# Patient Record
Sex: Male | Born: 1970 | Race: Black or African American | Hispanic: No | Marital: Married | Smoking: Former smoker
Health system: Southern US, Community
[De-identification: ages and names within clinical notes are randomized; demographics above are authoritative.]

## PROBLEM LIST (undated history)

## (undated) DIAGNOSIS — M5430 Sciatica, unspecified side: Secondary | ICD-10-CM

## (undated) DIAGNOSIS — J302 Other seasonal allergic rhinitis: Secondary | ICD-10-CM

## (undated) HISTORY — PX: NECK SURGERY: SHX720

## (undated) HISTORY — PX: BACK SURGERY: SHX140

---

## 1998-12-14 ENCOUNTER — Emergency Department (HOSPITAL_COMMUNITY): Admission: EM | Admit: 1998-12-14 | Discharge: 1998-12-14 | Payer: Self-pay | Admitting: Emergency Medicine

## 1998-12-23 ENCOUNTER — Emergency Department (HOSPITAL_COMMUNITY): Admission: EM | Admit: 1998-12-23 | Discharge: 1998-12-23 | Payer: Self-pay | Admitting: Emergency Medicine

## 1999-01-09 ENCOUNTER — Emergency Department (HOSPITAL_COMMUNITY): Admission: EM | Admit: 1999-01-09 | Discharge: 1999-01-09 | Payer: Self-pay | Admitting: Internal Medicine

## 1999-04-13 ENCOUNTER — Emergency Department (HOSPITAL_COMMUNITY): Admission: EM | Admit: 1999-04-13 | Discharge: 1999-04-13 | Payer: Self-pay | Admitting: Emergency Medicine

## 2000-01-11 ENCOUNTER — Emergency Department (HOSPITAL_COMMUNITY): Admission: EM | Admit: 2000-01-11 | Discharge: 2000-01-11 | Payer: Self-pay | Admitting: Internal Medicine

## 2001-09-24 ENCOUNTER — Emergency Department (HOSPITAL_COMMUNITY): Admission: EM | Admit: 2001-09-24 | Discharge: 2001-09-24 | Payer: Self-pay | Admitting: Emergency Medicine

## 2001-09-26 ENCOUNTER — Emergency Department (HOSPITAL_COMMUNITY): Admission: EM | Admit: 2001-09-26 | Discharge: 2001-09-26 | Payer: Self-pay | Admitting: Emergency Medicine

## 2002-10-12 ENCOUNTER — Emergency Department (HOSPITAL_COMMUNITY): Admission: EM | Admit: 2002-10-12 | Discharge: 2002-10-13 | Payer: Self-pay | Admitting: Emergency Medicine

## 2002-11-20 ENCOUNTER — Emergency Department (HOSPITAL_COMMUNITY): Admission: EM | Admit: 2002-11-20 | Discharge: 2002-11-20 | Payer: Self-pay

## 2006-04-24 ENCOUNTER — Emergency Department (HOSPITAL_COMMUNITY): Admission: EM | Admit: 2006-04-24 | Discharge: 2006-04-24 | Payer: Self-pay | Admitting: *Deleted

## 2008-10-28 ENCOUNTER — Emergency Department (HOSPITAL_COMMUNITY): Admission: EM | Admit: 2008-10-28 | Discharge: 2008-10-28 | Payer: Self-pay | Admitting: Emergency Medicine

## 2009-04-30 ENCOUNTER — Emergency Department (HOSPITAL_COMMUNITY): Admission: EM | Admit: 2009-04-30 | Discharge: 2009-04-30 | Payer: Self-pay | Admitting: Emergency Medicine

## 2009-05-06 ENCOUNTER — Emergency Department (HOSPITAL_COMMUNITY): Admission: EM | Admit: 2009-05-06 | Discharge: 2009-05-06 | Payer: Self-pay | Admitting: Pediatrics

## 2009-05-08 ENCOUNTER — Ambulatory Visit (HOSPITAL_COMMUNITY): Admission: RE | Admit: 2009-05-08 | Discharge: 2009-05-08 | Payer: Self-pay | Admitting: Orthopedic Surgery

## 2009-05-11 ENCOUNTER — Emergency Department (HOSPITAL_COMMUNITY): Admission: EM | Admit: 2009-05-11 | Discharge: 2009-05-11 | Payer: Self-pay | Admitting: Emergency Medicine

## 2009-05-21 ENCOUNTER — Emergency Department (HOSPITAL_COMMUNITY): Admission: EM | Admit: 2009-05-21 | Discharge: 2009-05-21 | Payer: Self-pay | Admitting: Emergency Medicine

## 2009-08-17 ENCOUNTER — Inpatient Hospital Stay (HOSPITAL_COMMUNITY): Admission: EM | Admit: 2009-08-17 | Discharge: 2009-08-19 | Payer: Self-pay | Admitting: Emergency Medicine

## 2009-10-06 ENCOUNTER — Encounter: Admission: RE | Admit: 2009-10-06 | Discharge: 2009-11-04 | Payer: Self-pay | Admitting: Orthopedic Surgery

## 2010-11-29 ENCOUNTER — Encounter: Payer: Self-pay | Admitting: Orthopedic Surgery

## 2011-02-10 LAB — CBC
HCT: 41.7 % (ref 39.0–52.0)
HCT: 43.9 % (ref 39.0–52.0)
Hemoglobin: 14 g/dL (ref 13.0–17.0)
Hemoglobin: 15 g/dL (ref 13.0–17.0)
MCHC: 33.7 g/dL (ref 30.0–36.0)
MCHC: 34.2 g/dL (ref 30.0–36.0)
MCV: 89.9 fL (ref 78.0–100.0)
MCV: 91 fL (ref 78.0–100.0)
Platelets: 165 10*3/uL (ref 150–400)
Platelets: 172 10*3/uL (ref 150–400)
RBC: 4.58 MIL/uL (ref 4.22–5.81)
RBC: 4.89 MIL/uL (ref 4.22–5.81)
RDW: 15 % (ref 11.5–15.5)
RDW: 15.1 % (ref 11.5–15.5)
WBC: 5.4 10*3/uL (ref 4.0–10.5)
WBC: 6.3 10*3/uL (ref 4.0–10.5)

## 2011-02-10 LAB — BASIC METABOLIC PANEL
BUN: 6 mg/dL (ref 6–23)
CO2: 24 mEq/L (ref 19–32)
Calcium: 9.4 mg/dL (ref 8.4–10.5)
Chloride: 105 mEq/L (ref 96–112)
Creatinine, Ser: 0.84 mg/dL (ref 0.4–1.5)
GFR calc Af Amer: >60 mL/min (ref >60)
GFR calc non Af Amer: >60 mL/min (ref >60)
Glucose, Bld: 93 mg/dL (ref 70–99)
Potassium: 3.5 mEq/L (ref 3.5–5.1)
Sodium: 138 mEq/L (ref 135–145)

## 2011-02-10 LAB — APTT: aPTT: 25 seconds (ref 24–37)

## 2011-02-10 LAB — PROTIME-INR
INR: 0.95 (ref 0.00–1.49)
Prothrombin Time: 12.6 seconds (ref 11.6–15.2)

## 2011-02-10 LAB — DIFFERENTIAL
Basophils Absolute: 0 10*3/uL (ref 0.0–0.1)
Basophils Relative: 0 % (ref 0–1)
Eosinophils Absolute: 0.3 10*3/uL (ref 0.0–0.7)
Eosinophils Relative: 4 % (ref 0–5)
Lymphocytes Relative: 19 % (ref 12–46)
Lymphs Abs: 1.2 10*3/uL (ref 0.7–4.0)
Monocytes Absolute: 0.5 10*3/uL (ref 0.1–1.0)
Monocytes Relative: 7 % (ref 3–12)
Neutro Abs: 4.4 10*3/uL (ref 1.7–7.7)
Neutrophils Relative %: 70 % (ref 43–77)

## 2011-08-12 LAB — URINALYSIS, ROUTINE W REFLEX MICROSCOPIC
Bilirubin Urine: NEGATIVE
Glucose, UA: NEGATIVE mg/dL
Hgb urine dipstick: NEGATIVE
Ketones, ur: NEGATIVE mg/dL
Nitrite: NEGATIVE
Protein, ur: NEGATIVE mg/dL
Specific Gravity, Urine: 1.019 (ref 1.005–1.030)
Urobilinogen, UA: 0.2 mg/dL (ref 0.0–1.0)
pH: 7 (ref 5.0–8.0)

## 2012-03-15 ENCOUNTER — Encounter (HOSPITAL_COMMUNITY): Payer: Self-pay | Admitting: Cardiology

## 2012-03-15 ENCOUNTER — Emergency Department (HOSPITAL_COMMUNITY)
Admission: EM | Admit: 2012-03-15 | Discharge: 2012-03-15 | Disposition: A | Discharge status: Home / Self Care | Payer: 59 | Attending: Emergency Medicine | Admitting: Emergency Medicine

## 2012-03-15 DIAGNOSIS — M545 Low back pain, unspecified: Secondary | ICD-10-CM | POA: Insufficient documentation

## 2012-03-15 DIAGNOSIS — F17200 Nicotine dependence, unspecified, uncomplicated: Secondary | ICD-10-CM | POA: Insufficient documentation

## 2012-03-15 DIAGNOSIS — M549 Dorsalgia, unspecified: Secondary | ICD-10-CM

## 2012-03-15 MED ORDER — DIAZEPAM 5 MG PO TABS: 5.0000 mg | ORAL_TABLET | Freq: Two times a day (BID) | ORAL | Status: AC

## 2012-03-15 MED ORDER — OXYCODONE-ACETAMINOPHEN 5-325 MG PO TABS: 1.0000 | ORAL_TABLET | Freq: Four times a day (QID) | ORAL | Status: AC | PRN

## 2012-03-15 NOTE — ED Notes (Signed)
Pt reports that he was working yesterday and was moving something. Reports that he felt a sharp pain in his back. Reports taking tylenol with no relief.

## 2012-03-15 NOTE — Discharge Instructions (Signed)
Followup with orthopedics if symptoms continue after one week. Use conservative methods at home including heat therapy and cold therapy as we discussed. More information on cold therapy is listed below.  It is not recommended to use heat treatment directly after an acute injury.  Use percocet for severe pain.  Do not drive or operate heavy machinery while using Percocet or Valium.  SEEK IMMEDIATE MEDICAL ATTENTION IF: New numbness, tingling, weakness, or problem with the use of your arms or legs.  Severe back pain not relieved with medications.  Change in bowel or bladder control.  Increasing pain in any areas of the body (such as chest or abdominal pain).  Shortness of breath, dizziness or fainting.  Nausea (feeling sick to your stomach), vomiting, fever, or sweats.  COLD THERAPY DIRECTIONS:  Ice or gel packs can be used to reduce both pain and swelling. Ice is the most helpful within the first 24 to 48 hours after an injury or flareup from overusing a muscle or joint.  Ice is effective, has very few side effects, and is safe for most people to use.   If you expose your skin to cold temperatures for too long or without the proper protection, you can damage your skin or nerves. Watch for signs of skin damage due to cold.   HOME CARE INSTRUCTIONS  Follow these tips to use ice and cold packs safely.  Place a dry or damp towel between the ice and skin. A damp towel will cool the skin more quickly, so you may need to shorten the time that the ice is used.  For a more rapid response, add gentle compression to the ice.  Ice for no more than 10 to 20 minutes at a time. The bonier the area you are icing, the less time it will take to get the benefits of ice.  Check your skin after 5 minutes to make sure there are no signs of a poor response to cold or skin damage.  Rest 20 minutes or more in between uses.  Once your skin is numb, you can end your treatment. You can test numbness by very lightly  touching your skin. The touch should be so light that you do not see the skin dimple from the pressure of your fingertip. When using ice, most people will feel these normal sensations in this order: cold, burning, aching, and numbness.  Do not use ice on someone who cannot communicate their responses to pain, such as small children or people with dementia.   HOW TO MAKE AN ICE PACK  To make an ice pack, do one of the following:  Place crushed ice or a bag of frozen vegetables in a sealable plastic bag. Squeeze out the excess air. Place this bag inside another plastic bag. Slide the bag into a pillowcase or place a damp towel between your skin and the bag.  Mix 3 parts water with 1 part rubbing alcohol. Freeze the mixture in a sealable plastic bag. When you remove the mixture from the freezer, it will be slushy. Squeeze out the excess air. Place this bag inside another plastic bag. Slide the bag into a pillowcase or place a damp towel between your skin and the bag.   SEEK MEDICAL CARE IF:  You develop white spots on your skin. This may give the skin a blotchy (mottled) appearance.  Your skin turns blue or pale.  Your skin becomes waxy or hard.  Your swelling gets worse.  MAKE SURE YOU:  Understand these instructions.  Will watch your condition.  Will get help right away if you are not doing well or get worse.   When patients come to the Emergency Department (ED) with acute medical conditions in which the Emergency Department physician feels appropriate to prescribe narcotic or sedating pain medication, the physician will prescribe these in very limited quantities.  The amount of these medications will last only until you can see your primary care physician in his/her office.  Any patient who returns to the ED seeking refills should expect only non-narcotic pain medications.    Prescriptions for narcotic or sedating medications that have been lost, stolen or expired will not be refilled in the  Emergency Department.

## 2012-03-15 NOTE — ED Provider Notes (Signed)
History     CSN: [id number]  Arrival date & time 03/15/12  0715   First MD Initiated Contact with Patient 03/15/12 [phone]      Chief Complaint  Patient presents with  . Back Pain    (Consider location/radiation/quality/duration/timing/severity/associated sxs/prior treatment) HPI Comments: Patient comes in today with a chief complaint of right sided lower back pain.  He reports that he was pulling a palate at work yesterday when the pain begun.  He reports that the pain feels like a pulled muscle.  Pain worse with bending and twisting of lower back.  He has taken tylenol for the pain without relief.    Patient is a 41 y.o. male presenting with back pain. The history is provided by the patient.  Back Pain  The pain does not radiate. The symptoms are aggravated by bending and twisting. Pertinent negatives include no fever, no numbness, no abdominal pain, no bowel incontinence, no perianal numbness, no bladder incontinence, no dysuria, no leg pain, no paresthesias, no tingling and no weakness.    History reviewed. No pertinent past medical history.  Past Surgical History  Procedure Date  . Back surgery     History reviewed. No pertinent family history.  History  Substance Use Topics  . Smoking status: Current Everyday Smoker -- 0.5 packs/day  . Smokeless tobacco: Not on file  . Alcohol Use: Yes      Review of Systems  Constitutional: Negative for fever and chills.  HENT: Negative for neck pain and neck stiffness.   Gastrointestinal: Negative for abdominal pain and bowel incontinence.  Genitourinary: Negative for bladder incontinence, dysuria and decreased urine volume.       No bowel or bladder incontinence  Musculoskeletal: Positive for back pain. Negative for joint swelling.       Pain with ambulation  Skin: Negative for rash.  Neurological: Negative for tingling, weakness, numbness and paresthesias.    Allergies  Review of patient's allergies indicates no known  allergies.  Home Medications  No current outpatient prescriptions on file.  BP 110/73  Pulse 111  Temp(Src) 98 F (36.7 C) (Oral)  Resp 16  SpO2 100%  Physical Exam  Nursing note and vitals reviewed. Constitutional: He is oriented to person, place, and time. He appears well-developed and well-nourished. No distress.  HENT:  Head: Normocephalic and atraumatic.  Eyes: EOM are normal.  Neck: Normal range of motion and full passive range of motion without pain. Neck supple. No spinous process tenderness and no muscular tenderness present. No rigidity. Normal range of motion present.  Cardiovascular: Normal rate, regular rhythm and intact distal pulses.  Exam reveals no gallop and no friction rub.   No murmur heard. Pulmonary/Chest: Effort normal and breath sounds normal. No respiratory distress. He has no wheezes. He has no rales. He exhibits no tenderness.  Musculoskeletal:       Cervical back: He exhibits normal range of motion, no tenderness, no bony tenderness and no pain.       Thoracic back: He exhibits no tenderness, no bony tenderness and no pain.       Lumbar back: He exhibits no tenderness, no bony tenderness, no spasm and normal pulse.       Bilateral lower extremities nontender without color change, baseline range of motion of extremities with intact distal pulses, capillary refill less than 2 seconds bilaterally.  Pt has increased pain w ROM of lumbar spine. Pain w ambulation, no sign of ataxia.  Neurological: He is alert and oriented  to person, place, and time. He has normal strength and normal reflexes. No sensory deficit. Gait (no ataxia, slowed and hunched d/t pain ) abnormal.       sensation at baseline for light touch in all 4 distal extremities, motor symmetric & bilateral 5/5  Abduction,adduction,flexion of hips, knee flexion & extension, foot dorsiflexion, foot plantar flexion.  Skin: Skin is warm and dry. No rash noted. He is not diaphoretic. No erythema. No pallor.    Psychiatric: He has a normal mood and affect.    ED Course  Procedures (including critical care time)  Labs Reviewed - No data to display No results found.   No diagnosis found.    MDM  Patient with back pain.  No neurological deficits and normal neuro exam.  Patient can walk but states is painful.  No loss of bowel or bladder control.  No concern for cauda equina.  No fever, night sweats, weight loss, h/o cancer, IVDU.  RICE protocol and pain medicine indicated and discussed with patient. Suspect that back pain is caused by a muscle strain.  Patient given short course of pain medication and muscle         Pascal Lux [place], [place] 03/15/12 [phone]

## 2012-03-15 NOTE — ED Notes (Signed)
Error on vitals wrong patient

## 2012-03-15 NOTE — ED Provider Notes (Signed)
Medical screening examination/treatment/procedure(s) were performed by non-physician practitioner and as supervising physician I was immediately available for consultation/collaboration.   Glynn Octave, MD 03/15/12 1538

## 2012-06-12 ENCOUNTER — Emergency Department (HOSPITAL_COMMUNITY)
Admission: EM | Admit: 2012-06-12 | Discharge: 2012-06-12 | Disposition: A | Discharge status: Home / Self Care | Payer: 59 | Attending: Emergency Medicine | Admitting: Emergency Medicine

## 2012-06-12 ENCOUNTER — Encounter (HOSPITAL_COMMUNITY): Payer: Self-pay | Admitting: Emergency Medicine

## 2012-06-12 ENCOUNTER — Emergency Department (HOSPITAL_COMMUNITY): Payer: 59

## 2012-06-12 DIAGNOSIS — M25569 Pain in unspecified knee: Secondary | ICD-10-CM | POA: Insufficient documentation

## 2012-06-12 DIAGNOSIS — F17200 Nicotine dependence, unspecified, uncomplicated: Secondary | ICD-10-CM | POA: Insufficient documentation

## 2012-06-12 DIAGNOSIS — M25561 Pain in right knee: Secondary | ICD-10-CM

## 2012-06-12 MED ORDER — IBUPROFEN 600 MG PO TABS: 600.0000 mg | ORAL_TABLET | Freq: Four times a day (QID) | ORAL | Status: AC | PRN

## 2012-06-12 MED ORDER — HYDROCODONE-ACETAMINOPHEN 5-500 MG PO TABS: 1.0000 | ORAL_TABLET | Freq: Four times a day (QID) | ORAL | Status: AC | PRN

## 2012-06-12 MED ORDER — OXYCODONE-ACETAMINOPHEN 5-325 MG PO TABS
1.0000 | ORAL_TABLET | Freq: Once | ORAL | Status: AC
  Administered 2012-06-12: 1 via ORAL
  Filled 2012-06-12: qty 1

## 2012-06-12 NOTE — ED Notes (Signed)
Ortho tech called 

## 2012-06-12 NOTE — ED Provider Notes (Signed)
History     CSN: [id number]  Arrival date & time 06/12/12  0826   First MD Initiated Contact with Patient 06/12/12 [phone]      Chief Complaint  Patient presents with  . Knee Pain    (Consider location/radiation/quality/duration/timing/severity/associated sxs/prior treatment) Patient is a 41 y.o. male presenting with knee pain. The history is provided by the patient.  Knee Pain This is a new problem. The current episode started yesterday. The problem occurs constantly. The problem has been unchanged. Associated symptoms include arthralgias. Pertinent negatives include no chest pain, chills, coughing, fever, joint swelling, numbness, rash or weakness. The symptoms are aggravated by bending and walking. He has tried nothing for the symptoms. The treatment provided no relief.    History reviewed. No pertinent past medical history.  Past Surgical History  Procedure Date  . Back surgery     History reviewed. No pertinent family history.  History  Substance Use Topics  . Smoking status: Current Everyday Smoker -- 0.5 packs/day  . Smokeless tobacco: Not on file  . Alcohol Use: Yes      Review of Systems  Constitutional: Negative for fever and chills.  Respiratory: Negative for cough, chest tightness and shortness of breath.   Cardiovascular: Negative for chest pain, palpitations and leg swelling.  Musculoskeletal: Positive for arthralgias. Negative for joint swelling.  Skin: Negative for rash and wound.  Neurological: Negative for weakness and numbness.    Allergies  Review of patient's allergies indicates no known allergies.  Home Medications   Current Outpatient Rx  Name Route Sig Dispense Refill  . IBUPROFEN 200 MG PO TABS Oral Take 200 mg by mouth every 6 (six) hours as needed. pain      BP 122/74  Temp 98.6 F (37 C) (Oral)  Resp 18  SpO2 100%  Physical Exam  Nursing note and vitals reviewed. Constitutional: He appears well-developed and well-nourished. No  distress.  HENT:  Head: Normocephalic.  Eyes: Conjunctivae are normal.  Cardiovascular: Normal rate, regular rhythm and normal heart sounds.   Pulmonary/Chest: Effort normal and breath sounds normal. No respiratory distress. He has no wheezes. He has no rales.  Musculoskeletal:       Right knee normal appearing when compared to left. No obvious swelling, redness, bruising, wounds. Tender to palpation with even light touch over medial joint. Tenderness over skin, area sensitive. No pain with palpation of the posterior knee, lateral knee joint, anterior knee. Normal calf, soft, non tender. Dorsal pedal pulses normal and equal bilaterally. Pain with rom of the knee. Joint is stable with negative anterior, posterior drawer signs.   Neurological: He is alert.  Skin: Skin is warm and dry. No rash noted.  Psychiatric: He has a normal mood and affect.    ED Course  Procedures (including critical care time)  Labs Reviewed - No data to display Dg Knee Complete 4 Views Right  06/12/2012  *RADIOLOGY REPORT*  Clinical Data: Right knee pain.  RIGHT KNEE - COMPLETE 4+ VIEW  Comparison: None.  Findings: There is chondrocalcinosis.  Mild degenerative changes in the right knee. No acute bony abnormality.  Specifically, no fracture, subluxation, or dislocation.  Soft tissues are intact. No joint effusion.  IMPRESSION: No acute bony abnormality.  Original Report Authenticated By: Cyndie Chime, M.D.    X-ray negative. Joint is stable. No signs  Of infection. No injuries. There is sensitivity and pain with even light skin deep touch to the medial right knee. Suspect possible early shingles vs  gout. Knee sleeve for swelling. Pain medications at home. Follow up with orthopedics.   1. Knee pain, right       MDM          Lottie Mussel, PA 06/12/12 [phone]

## 2012-06-12 NOTE — ED Notes (Signed)
Pt reports right knee pain and swelling that began around 4am. Pt denies known injury.  Pt reports increased pain with ambulation.

## 2012-06-14 NOTE — ED Provider Notes (Signed)
Medical screening examination/treatment/procedure(s) were performed by non-physician practitioner and as supervising physician I was immediately available for consultation/collaboration.  Kennah Hehr T Tenea Sens, MD 06/14/12 0815 

## 2014-03-28 ENCOUNTER — Encounter (HOSPITAL_COMMUNITY): Payer: Self-pay | Admitting: Emergency Medicine

## 2014-03-28 ENCOUNTER — Emergency Department (HOSPITAL_COMMUNITY)
Admission: EM | Admit: 2014-03-28 | Discharge: 2014-03-28 | Disposition: A | Discharge status: Home / Self Care | Payer: 59 | Attending: Emergency Medicine | Admitting: Emergency Medicine

## 2014-03-28 DIAGNOSIS — F17200 Nicotine dependence, unspecified, uncomplicated: Secondary | ICD-10-CM | POA: Insufficient documentation

## 2014-03-28 DIAGNOSIS — M5431 Sciatica, right side: Secondary | ICD-10-CM

## 2014-03-28 DIAGNOSIS — R209 Unspecified disturbances of skin sensation: Secondary | ICD-10-CM | POA: Insufficient documentation

## 2014-03-28 DIAGNOSIS — M5430 Sciatica, unspecified side: Secondary | ICD-10-CM | POA: Insufficient documentation

## 2014-03-28 DIAGNOSIS — IMO0001 Reserved for inherently not codable concepts without codable children: Secondary | ICD-10-CM | POA: Insufficient documentation

## 2014-03-28 DIAGNOSIS — Z9889 Other specified postprocedural states: Secondary | ICD-10-CM | POA: Insufficient documentation

## 2014-03-28 HISTORY — DX: Sciatica, unspecified side: M54.30

## 2014-03-28 MED ORDER — PREDNISONE 20 MG PO TABS: ORAL_TABLET | ORAL | Status: DC

## 2014-03-28 MED ORDER — OXYCODONE-ACETAMINOPHEN 5-325 MG PO TABS
2.0000 | ORAL_TABLET | Freq: Once | ORAL | Status: AC
  Administered 2014-03-28: 2 via ORAL
  Filled 2014-03-28: qty 2

## 2014-03-28 MED ORDER — OXYCODONE-ACETAMINOPHEN 5-325 MG PO TABS: 2.0000 | ORAL_TABLET | ORAL | Status: DC | PRN

## 2014-03-28 MED ORDER — METHOCARBAMOL 500 MG PO TABS: 500.0000 mg | ORAL_TABLET | Freq: Two times a day (BID) | ORAL | Status: DC

## 2014-03-28 NOTE — ED Notes (Addendum)
Pt moved to rm Fast [address], crying, unable to stand, dragging leg,

## 2014-03-28 NOTE — ED Provider Notes (Signed)
CSN: [id number]     Arrival date & time 03/28/14  1314 History  This chart was scribed for non-physician practitioner, Fayrene Helper, PA-C working with Gwyneth Sprout, MD by Greggory Stallion, ED scribe. This patient was seen in room TR05C/TR05C and the patient's care was started at 2:06 PM.   Chief Complaint  Patient presents with  . Back Pain   The history is provided by the patient. No language interpreter was used.   HPI Comments: Gabriel Norman is a 43 y.o. male with history of sciatic and herniated disc who presents to the Emergency Department complaining of gradual onset, sharp, stabbing, aching lower back pain that radiates into his right buttocks and down the back of his leg that started a couple of months ago. Pt also has associated numbness in his right leg and foot that started 2 weeks ago. Denies recent fall or injury. He has had intermittent back pain for about 3 years. Bearing weight and movement worsen the pain. He has taken Goody powders and ibuprofen with no relief. Denies fever, bowel or bladder incontinence, saddle paraesthesias, rash. Denies history of cancer or IV drug use. Pt had back surgery for a slipped disc done by Dr. Manson Passey 3 years ago.   Past Medical History  Diagnosis Date  . Sciatica    Past Surgical History  Procedure Laterality Date  . Back surgery     No family history on file. History  Substance Use Topics  . Smoking status: Current Every Day Smoker -- 0.50 packs/day  . Smokeless tobacco: Not on file  . Alcohol Use: Yes    Review of Systems  Constitutional: Negative for fever.  Genitourinary:       Negative for bowel or bladder incontinence.  Musculoskeletal: Positive for back pain and myalgias.  Skin: Negative for rash.  Neurological: Positive for numbness.  All other systems reviewed and are negative.  Allergies  Review of patient's allergies indicates no known allergies.  Home Medications   Prior to Admission medications   Medication Sig  Start Date End Date Taking? Authorizing Provider  ibuprofen (ADVIL,MOTRIN) 200 MG tablet Take 200 mg by mouth every 6 (six) hours as needed. pain    Historical Provider, MD   BP 119/75  Pulse 79  Temp(Src) 97.5 F (36.4 C) (Oral)  Resp 18  SpO2 100%  Physical Exam  Nursing note and vitals reviewed. Constitutional: He is oriented to person, place, and time. He appears well-developed and well-nourished. No distress.  HENT:  Head: Normocephalic and atraumatic.  Eyes: EOM are normal.  Neck: Neck supple. No tracheal deviation present.  Cardiovascular: Normal rate and intact distal pulses.   Pulmonary/Chest: Effort normal. No respiratory distress.  Musculoskeletal: Normal range of motion. He exhibits tenderness. He exhibits no edema.  Tenderness to lumbar spine and lumbosacral region.   Positive straight leg raise.    Neurological: He is alert and oriented to person, place, and time. He has normal reflexes.  Skin: Skin is warm and dry.  Psychiatric: He has a normal mood and affect. His behavior is normal.    ED Course  Procedures (including critical care time)  DIAGNOSTIC STUDIES: Oxygen Saturation is 100% on RA, normal by my interpretation.    COORDINATION OF CARE: 2:12 PM-Discussed treatment plan which includes pain medication, a muscle relaxer and a steroid with pt at bedside and pt agreed to plan. Will give pt orthopedic referral and advised him to follow up. Suspect R side sciatica. No red flags.  Doubt  caudal equina syndrome.   Labs Review Labs Reviewed - No data to display  Imaging Review No results found.   EKG Interpretation None      MDM   Final diagnoses:  Sciatica of right side    BP 119/75  Pulse 79  Temp(Src) 97.5 F (36.4 C) (Oral)  Resp 18  SpO2 100%   I personally performed the services described in this documentation, which was scribed in my presence. The recorded information has been reviewed and is accurate.  Fayrene HelperBowie Jacklyn Branan, PA-C 03/28/14  1418

## 2014-03-28 NOTE — ED Provider Notes (Signed)
Medical screening examination/treatment/procedure(s) were performed by non-physician practitioner and as supervising physician I was immediately available for consultation/collaboration.   EKG Interpretation None        Aneliese Beaudry, MD 03/28/14 1506 

## 2014-03-28 NOTE — ED Notes (Addendum)
Pt c/o lower back pain to right leg "for months". Has gotten worse past several weeks. Had back surgery 3 years ago by "Dr. Manson Passey". (?)  States has not seen a doctor about this. States is unable to move right leg.

## 2014-03-28 NOTE — Discharge Instructions (Signed)

## 2014-03-28 NOTE — ED Notes (Addendum)
Pt reports that he is having lower back pain into right buttocks and leg pain. Has hx of chronic back pain. Walked to triage room using both legs with assistance of crutches due to pressure on that leg is painful.

## 2014-04-18 ENCOUNTER — Other Ambulatory Visit (HOSPITAL_COMMUNITY): Payer: Self-pay | Admitting: Orthopaedic Surgery

## 2014-04-18 DIAGNOSIS — M545 Low back pain, unspecified: Secondary | ICD-10-CM

## 2014-04-25 ENCOUNTER — Ambulatory Visit (HOSPITAL_COMMUNITY)
Admission: RE | Admit: 2014-04-25 | Discharge: 2014-04-25 | Disposition: A | Discharge status: Home / Self Care | Payer: 59 | Source: Ambulatory Visit | Attending: Orthopaedic Surgery | Admitting: Orthopaedic Surgery

## 2014-04-25 DIAGNOSIS — M48061 Spinal stenosis, lumbar region without neurogenic claudication: Secondary | ICD-10-CM | POA: Insufficient documentation

## 2014-04-25 DIAGNOSIS — M545 Low back pain, unspecified: Secondary | ICD-10-CM

## 2014-04-25 DIAGNOSIS — M5126 Other intervertebral disc displacement, lumbar region: Secondary | ICD-10-CM | POA: Insufficient documentation

## 2014-04-25 DIAGNOSIS — M5430 Sciatica, unspecified side: Secondary | ICD-10-CM | POA: Insufficient documentation

## 2014-04-25 MED ORDER — GADOBENATE DIMEGLUMINE 529 MG/ML IV SOLN
15.0000 mL | Freq: Once | INTRAVENOUS | Status: AC | PRN
  Administered 2014-04-25: 15 mL via INTRAVENOUS

## 2014-07-29 ENCOUNTER — Encounter (HOSPITAL_COMMUNITY): Payer: Self-pay | Admitting: Emergency Medicine

## 2014-07-29 ENCOUNTER — Emergency Department (HOSPITAL_COMMUNITY)
Admission: EM | Admit: 2014-07-29 | Discharge: 2014-07-29 | Disposition: A | Discharge status: Home / Self Care | Payer: Worker's Compensation | Attending: Emergency Medicine | Admitting: Emergency Medicine

## 2014-07-29 DIAGNOSIS — Z9889 Other specified postprocedural states: Secondary | ICD-10-CM | POA: Insufficient documentation

## 2014-07-29 DIAGNOSIS — F17200 Nicotine dependence, unspecified, uncomplicated: Secondary | ICD-10-CM | POA: Insufficient documentation

## 2014-07-29 DIAGNOSIS — M545 Low back pain, unspecified: Secondary | ICD-10-CM | POA: Insufficient documentation

## 2014-07-29 DIAGNOSIS — M5430 Sciatica, unspecified side: Secondary | ICD-10-CM | POA: Insufficient documentation

## 2014-07-29 DIAGNOSIS — M5431 Sciatica, right side: Secondary | ICD-10-CM

## 2014-07-29 DIAGNOSIS — Z7982 Long term (current) use of aspirin: Secondary | ICD-10-CM | POA: Insufficient documentation

## 2014-07-29 DIAGNOSIS — Z79899 Other long term (current) drug therapy: Secondary | ICD-10-CM | POA: Insufficient documentation

## 2014-07-29 MED ORDER — METHOCARBAMOL 500 MG PO TABS: 500.0000 mg | ORAL_TABLET | Freq: Two times a day (BID) | ORAL | Status: DC

## 2014-07-29 MED ORDER — HYDROCODONE-ACETAMINOPHEN 5-325 MG PO TABS: 2.0000 | ORAL_TABLET | ORAL | Status: DC | PRN

## 2014-07-29 MED ORDER — HYDROCODONE-ACETAMINOPHEN 5-325 MG PO TABS
2.0000 | ORAL_TABLET | Freq: Once | ORAL | Status: AC
  Administered 2014-07-29: 2 via ORAL
  Filled 2014-07-29: qty 2

## 2014-07-29 MED ORDER — PREDNISONE 20 MG PO TABS: 40.0000 mg | ORAL_TABLET | Freq: Every day | ORAL | Status: DC

## 2014-07-29 NOTE — Discharge Instructions (Signed)
Take prednisone as directed until gone. Take Vicodin as needed for pain. Take Robaxin as needed for muscle spasm. Follow up with Dr. Ophelia Charter for further evaluation and management of your pain.

## 2014-07-29 NOTE — ED Provider Notes (Signed)
CSN: [id number]     Arrival date & time 07/29/14  1191 History  This chart was scribed for non-physician practitioner Emilia Beck, PA-C working with Doug Sou, MD by Leone Payor, ED Scribe. This patient was seen in room TR07C/TR07C and the patient's care was started at 9:28 AM.    Chief Complaint  Patient presents with  . Back Pain    The history is provided by the patient. No language interpreter was used.    HPI Comments: Gabriel Norman is a 43 y.o. male who presents to the Emergency Department complaining of 1 year of chronic, constant lower back pain with associated altered sensations to the left lower extremity and foot that has worsened over the last few days. He states he initially injured his back at work last year and continues to have pain now. He has been taking Goody Powder without relief. He states he has followed up with a Dr. Ophelia Charter who states patient needs surgery. He denies abdominal pain, difficulty with ambulation.   Past Medical History  Diagnosis Date  . Sciatica    Past Surgical History  Procedure Laterality Date  . Back surgery     No family history on file. History  Substance Use Topics  . Smoking status: Current Every Day Smoker -- 0.50 packs/day  . Smokeless tobacco: Not on file  . Alcohol Use: Yes    Review of Systems  Constitutional: Negative for fever, chills and fatigue.  HENT: Negative for trouble swallowing.   Eyes: Negative for visual disturbance.  Respiratory: Negative for shortness of breath.   Cardiovascular: Negative for chest pain and palpitations.  Gastrointestinal: Negative for nausea, vomiting, abdominal pain and diarrhea.  Genitourinary: Negative for dysuria and difficulty urinating.  Musculoskeletal: Positive for back pain. Negative for arthralgias, gait problem and neck pain.  Skin: Negative for color change.  Neurological: Negative for dizziness and weakness.  Psychiatric/Behavioral: Negative for dysphoric mood.       Allergies  Review of patient's allergies indicates no known allergies.  Home Medications   Prior to Admission medications   Medication Sig Start Date End Date Taking? Authorizing Provider  Aspirin-Acetaminophen-Caffeine (GOODY HEADACHE PO) Take 1 packet by mouth 4 (four) times daily as needed (for pain).    Historical Provider, MD  methocarbamol (ROBAXIN) 500 MG tablet Take 1 tablet (500 mg total) by mouth 2 (two) times daily. 03/28/14   Fayrene Helper, PA-C  oxyCODONE-acetaminophen (PERCOCET/ROXICET) 5-325 MG per tablet Take 2 tablets by mouth every 4 (four) hours as needed for severe pain. 03/28/14   Fayrene Helper, PA-C  predniSONE (DELTASONE) 20 MG tablet 3 tabs po day one, then 2 tabs daily x 4 days 03/28/14   Fayrene Helper, PA-C   BP 113/82  Pulse 79  Temp(Src) 98.4 F (36.9 C) (Oral)  Resp 18  SpO2 100% Physical Exam  Nursing note and vitals reviewed. Constitutional: He is oriented to person, place, and time. He appears well-developed and well-nourished.  HENT:  Head: Normocephalic and atraumatic.  Cardiovascular: Normal rate, regular rhythm and normal heart sounds.   Pulmonary/Chest: Effort normal and breath sounds normal. No respiratory distress. He has no wheezes. He has no rales.  Abdominal: Soft. He exhibits no distension. There is no tenderness.  Musculoskeletal:  No midline spine tenderness to palpation. Right gluteal tenderness to palpation. Right lumbar paraspinal tenderness to palpation.   Neurological: He is alert and oriented to person, place, and time.  Lower extremity strength and sensation equal and intact.   Skin:  Skin is warm and dry.  Psychiatric: He has a normal mood and affect.    ED Course  Procedures (including critical care time)  DIAGNOSTIC STUDIES: Oxygen Saturation is 100% on RA, normal by my interpretation.    COORDINATION OF CARE: 9:30 AM Will prescribe pain medication and muscle relaxant. Discussed treatment plan with pt at bedside and pt  agreed to plan.   Labs Review Labs Reviewed - No data to display  Imaging Review No results found.   EKG Interpretation None      MDM   Final diagnoses:  Sciatica, right    Patient likely having sciatica on the right. Patient is able to ambulate. No bladder/bowel incontinence or saddle paresthesias. Vitals stable and patient afebrile. Patient advised to follow up with Dr. Ophelia Charter. Patient will have prednisone, vicodin and robaxin for pain.   I personally performed the services described in this documentation, which was scribed in my presence. The recorded information has been reviewed and is accurate.   Emilia Beck, PA-C 07/29/14 [address] PA-C 07/29/14 1120

## 2014-07-29 NOTE — ED Notes (Signed)
Patient states he had an accident at work last year and is still having pain.   Patient was referred by hospital to ortho, which was workers comp.   Patient states he can't afford to go.   Patient states pain in lumbar region, and has bulging discs.

## 2014-07-29 NOTE — ED Provider Notes (Signed)
Medical screening examination/treatment/procedure(s) were performed by non-physician practitioner and as supervising physician I was immediately available for consultation/collaboration.   EKG Interpretation None       Doug Sou, MD 07/29/14 1622

## 2015-08-08 ENCOUNTER — Ambulatory Visit (INDEPENDENT_AMBULATORY_CARE_PROVIDER_SITE_OTHER): Payer: BLUE CROSS/BLUE SHIELD | Admitting: Family Medicine

## 2015-08-08 ENCOUNTER — Ambulatory Visit (INDEPENDENT_AMBULATORY_CARE_PROVIDER_SITE_OTHER): Payer: BLUE CROSS/BLUE SHIELD

## 2015-08-08 VITALS — BP 108/70 | HR 72 | Temp 98.5°F | Resp 18 | Ht 69.0 in | Wt 161.0 lb

## 2015-08-08 DIAGNOSIS — M25552 Pain in left hip: Secondary | ICD-10-CM | POA: Diagnosis not present

## 2015-08-08 DIAGNOSIS — R935 Abnormal findings on diagnostic imaging of other abdominal regions, including retroperitoneum: Secondary | ICD-10-CM

## 2015-08-08 DIAGNOSIS — R937 Abnormal findings on diagnostic imaging of other parts of musculoskeletal system: Secondary | ICD-10-CM

## 2015-08-08 MED ORDER — MELOXICAM 7.5 MG PO TABS: 7.5000 mg | ORAL_TABLET | Freq: Every day | ORAL | Status: DC | PRN

## 2015-08-08 MED ORDER — HYDROCODONE-ACETAMINOPHEN 5-325 MG PO TABS
1.0000 | ORAL_TABLET | Freq: Four times a day (QID) | ORAL | Status: DC | PRN
Start: 2015-08-08 — End: 2016-07-24

## 2015-08-08 NOTE — Progress Notes (Signed)
Subjective:    Patient ID: Gabriel Norman, male    DOB: 28-May-1971, 44 y.o.   MRN: [id number] This chart was scribed for Meredith Staggers, MD by Littie Deeds, Medical Scribe. This patient was seen in Room 3 and the patient's care was started at 9:59 AM.   HPI HPI Comments: Gabriel Norman is a 44 y.o. male who presents to the [facility] complaining of gradual onset, worsening left hip pain that started 3-4 days ago after waking up. Patient thinks he may have slept wrong. He took a few ibuprofen when the symptoms started with some relief. The next day, the pain worsened, so he took some more ibuprofen and goody powder. The pain is worse with bearing weight and changing positions. The pain is not worse when laying down. He has been using old crutches which reduces the pain when ambulating. The pain does not feel like sciatic pain he has had in the past. He feels as if the pain is muscular in nature. Patient denies activity change, fever, night sweats, unexplained weight loss, urinary symptoms, saddle anesthesia, and rash. He denies known injury and having similar pain in the past. He has had physical therapy in the past. 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   There are no active problems to display for this patient.  Past Medical History  Diagnosis Date  . Sciatica   . Allergy    Past Surgical History  Procedure Laterality Date  . Back surgery    . Neck surgery     No Known Allergies Prior to Admission medications   Medication Sig Start Date End Date Taking? Authorizing Provider  Aspirin-Acetaminophen-Caffeine (GOODY HEADACHE PO) Take 1 packet by mouth 4 (four) times daily as needed (for pain).   Yes Historical Provider, MD  ibuprofen (ADVIL,MOTRIN) 200 MG tablet Take 200 mg by mouth every 6 (six) hours as needed.   Yes Historical Provider, MD  HYDROcodone-acetaminophen (NORCO/VICODIN) 5-325 MG per tablet Take 2 tablets by mouth every 4 (four) hours as  needed for moderate pain or severe pain. Patient not taking: Reported on 08/08/2015 07/29/14   Emilia Beck, PA-C  methocarbamol (ROBAXIN) 500 MG tablet Take 1 tablet (500 mg total) by mouth 2 (two) times daily. Patient not taking: Reported on 08/08/2015 03/28/14   Fayrene Helper, PA-C  methocarbamol (ROBAXIN) 500 MG tablet Take 1 tablet (500 mg total) by mouth 2 (two) times daily. Patient not taking: Reported on 08/08/2015 07/29/14   Emilia Beck, PA-C  oxyCODONE-acetaminophen (PERCOCET/ROXICET) 5-325 MG per tablet Take 2 tablets by mouth every 4 (four) hours as needed for severe pain. Patient not taking: Reported on 08/08/2015 03/28/14   Fayrene Helper, PA-C  predniSONE (DELTASONE) 20 MG tablet 3 tabs po day one, then 2 tabs daily x 4 days Patient not taking: Reported on 08/08/2015 03/28/14   Fayrene Helper, PA-C  predniSONE (DELTASONE) 20 MG tablet Take 2 tablets (40 mg total) by mouth daily. Take 40 mg by mouth daily for 3 days, then 20mg  by mouth daily for 3 days, then 10mg  daily for 3 days Patient not taking: Reported on 08/08/2015 07/29/14   Emilia Beck, PA-C   Social History   Social History  . Marital Status: Single    Spouse Name: N/A  . Number of Children: N/A  . Years of Education: N/A   Occupational History  . Not on file.   Social History Main Topics  . Smoking status: Current  Every Day Smoker -- 0.50 packs/day  . Smokeless tobacco: Not on file  . Alcohol Use: 1.2 oz/week    2 Standard drinks or equivalent per week  . Drug Use: No  . Sexual Activity: Not on file   Other Topics Concern  . Not on file   Social History Narrative     Review of Systems  Constitutional: Negative for fever, diaphoresis, activity change and unexpected weight change.  Genitourinary: Negative.  Negative for enuresis.  Musculoskeletal: Positive for myalgias and arthralgias.  Skin: Negative for rash.  Neurological: Negative for numbness.       Objective:   Physical Exam  Constitutional: He  is oriented to person, place, and time. He appears well-developed and well-nourished. No distress.  HENT:  Head: Normocephalic and atraumatic.  Mouth/Throat: Oropharynx is clear and moist. No oropharyngeal exudate.  Eyes: Pupils are equal, round, and reactive to light.  Neck: Neck supple.  Cardiovascular: Normal rate.   Pulmonary/Chest: Effort normal.  Abdominal: Soft. There is no tenderness.  Musculoskeletal: He exhibits no edema.  Lumbar spine non-tender. Minimal left SI tenderness. Sciatic notch non-tender. No pain over trochanteric bursa. Left hip full flexion and extension. Negative Fadir. Negative Faber. Minimal discomfort posteriorly with terminal and internal rotation.  Neurological: He is alert and oriented to person, place, and time. No cranial nerve deficit.  Skin: Skin is warm and dry. No rash noted.  Psychiatric: He has a normal mood and affect. His behavior is normal.  Nursing note and vitals reviewed.  [facility] (PRIMARY) x-ray report read by Dr. Neva Seat:  Left hip - No apparent fracture or acute findings.  SI joint/pelvis - cystic vs. lytic appearing areas within the left iliac wing. Actual SI joint appears to be unaffected. No apparent cortex involvement.   XR report shows a 6 cm non-aggressive appearing lucent cystic lesion in the iliac bone. Appear to be chronic as also seen in the MRI of lumbar spine June last year. 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   Filed Vitals:   08/08/15 0923  BP: 108/70  Pulse: 72  Temp: 98.5 F (36.9 C)  TempSrc: Oral  Resp: 18  Height:  (1.753 m)  Weight: 161 lb (73.029 kg)  SpO2: 99%       Assessment & Plan:   Gabriel Norman is a 44 y.o. male Left hip pain - Plan: DG HIP UNILAT W OR W/O PELVIS 2-3 VIEWS LEFT, meloxicam (MOBIC) 7.5 MG tablet, HYDROcodone-acetaminophen (NORCO/VICODIN) 5-325 MG tablet  Arthralgia of left side of pelvis - Plan: DG Si Joints, meloxicam (MOBIC) 7.5 MG tablet, HYDROcodone-acetaminophen (NORCO/VICODIN)  5-325 MG tablet  Abnormal x-ray of pelvis  Suspected SI dysfunction or sacroiliitis.  No known injury or trauma. 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  -Mobic 7.5 mg 1-2 daily. Avoid other NSAIDs. Hydrocodone as needed for breakthrough pain. Crutches as needed until toe-touch weight-bear without difficulty then weight-bear as tolerated  -If not significantly improved in the next 4-5 days, would recommend MRI for further evaluation of this area in the cystic-appearing lesions.  RTC precautions  Meds ordered this encounter  Medications  . ibuprofen (ADVIL,MOTRIN) 200 MG tablet    Sig: Take 200 mg by mouth every 6 (six) hours as needed.  . meloxicam (MOBIC) 7.5 MG tablet    Sig: Take 1-2 tablets (7.5-15 mg total) by mouth daily as needed for pain.    Dispense:  30 tablet  Refill:  0  . HYDROcodone-acetaminophen (NORCO/VICODIN) 5-325 MG tablet    Sig: Take 1 tablet by mouth every 6 (six) hours as needed for moderate pain.    Dispense:  15 tablet    Refill:  0   Patient Instructions  You may  have a sprained ligament or strained area at the SI joint (lower back). Try the mobic each morning (do not combine with other over the counter pain relievers), hydrocodone if needed for breakthrough pain. Heat or ice to the areas needed. Range of motion as tolerated. Use crutches until you're not having any pain with weightbearing. Once you are able to toe touch weight-bear then can progress to weightbearing as tolerated as long as pain has improved. If symptoms have not significantly improved in next 4-5 days, I would recommend we check an MRI to further evaluate the abnormal findings on the initial x-ray here today.  Sacroiliac Joint Dysfunction The sacroiliac joint connects the lower part of the spine (the sacrum) with the bones of the pelvis. CAUSES  Sometimes, there is no obvious reason for sacroiliac joint dysfunction.  Other times, it may occur   During pregnancy.  After injury, such as:  Car accidents.  Sport-related injuries.  Work-related injuries.  Due to one leg being shorter than the other.  Due to other conditions that affect the joints, such as:  Rheumatoid arthritis.  Gout.  Psoriasis.  Joint infection (septic arthritis). SYMPTOMS  Symptoms may include:  Pain in the:  Lower back.  Buttocks.  Groin.  Thighs and legs.  Difficult sitting, standing, walking, lying, bending or lifting. DIAGNOSIS  A number of tests may be used to help diagnose the cause of sacroiliac joint dysfunction, including:  Imaging tests to look for other causes of pain, including:  MRI.  CT scan.  Bone scan.  Diagnostic injection: During a special x-ray (called fluoroscopy), a needle is put into the sacroiliac joint. A numbing medicine is injected into the joint. If the pain is improved or stopped, the diagnosis of sacroiliac joint dysfunction is more likely. TREATMENT  There are a number of types of treatment used for sacroiliac joint dysfunction, including:  Only take over-the-counter or prescription medicines for pain, discomfort, or fever as directed by your caregiver.  Medications to relax muscles.  Rest. Decreasing activity can help cut down on painful muscle spasms and allow the back to heal.  Application of heat or ice to the lower back may improve muscle spasms and soothe pain.  Brace. A special back brace, called a sacroiliac belt, can help support the joint while your back is healing.  Physical therapy can help teach comfortable positions and exercises to strengthen muscles that support the sacroiliac joint.  Cortisone injections. Injections of steroid medicine into the joint can help decrease swelling and improve pain.  Hyaluronic acid injections. This chemical improves lubrication within the sacroiliac joint, thereby decreasing pain.  Radiofrequency ablation. A special  needle is placed into the joint, where it burns away nerves that are carrying pain messages from the joint.  Surgery. Because pain occurs during movement of the joint, screws and plates may be installed in order to limit or prevent joint motion. HOME CARE INSTRUCTIONS   Take all medications exactly as directed.  Follow instructions regarding both rest and physical activity, to avoid worsening the pain.  Do physical therapy exercises exactly as prescribed. SEEK IMMEDIATE MEDICAL CARE IF:  You experience increasingly severe pain.  You develop new symptoms, such as numbness or tingling  in your legs or feet.  You lose bladder or bowel control. Document Released: 01/20/2009 Document Revised: 01/16/2012 Document Reviewed: 01/20/2009 [facility] Patient Information 2015 [place], [place]. This information is not intended to replace advice given to you by your health care provider. Make sure you discuss any questions you have with your health care provider.     I personally performed the services described in this documentation, which was scribed in my presence. The recorded information has been reviewed and considered, and addended by me as needed.      By signing my name below, I, Littie Deeds, attest that this documentation has been prepared under the direction and in the presence of Meredith Staggers, MD.  Electronically Signed: Littie Deeds, Medical Scribe. 08/08/2015. 9:59 AM.

## 2015-08-08 NOTE — Patient Instructions (Signed)
You may  have a sprained ligament or strained area at the SI joint (lower back). Try the mobic each morning (do not combine with other over the counter pain relievers), hydrocodone if needed for breakthrough pain. Heat or ice to the areas needed. Range of motion as tolerated. Use crutches until you're not having any pain with weightbearing. Once you are able to toe touch weight-bear then can progress to weightbearing as tolerated as long as pain has improved. If symptoms have not significantly improved in next 4-5 days, I would recommend we check an MRI to further evaluate the abnormal findings on the initial x-ray here today.  Sacroiliac Joint Dysfunction The sacroiliac joint connects the lower part of the spine (the sacrum) with the bones of the pelvis. CAUSES  Sometimes, there is no obvious reason for sacroiliac joint dysfunction. Other times, it may occur   During pregnancy.  After injury, such as:  Car accidents.  Sport-related injuries.  Work-related injuries.  Due to one leg being shorter than the other.  Due to other conditions that affect the joints, such as:  Rheumatoid arthritis.  Gout.  Psoriasis.  Joint infection (septic arthritis). SYMPTOMS  Symptoms may include:  Pain in the:  Lower back.  Buttocks.  Groin.  Thighs and legs.  Difficult sitting, standing, walking, lying, bending or lifting. DIAGNOSIS  A number of tests may be used to help diagnose the cause of sacroiliac joint dysfunction, including:  Imaging tests to look for other causes of pain, including:  MRI.  CT scan.  Bone scan.  Diagnostic injection: During a special x-ray (called fluoroscopy), a needle is put into the sacroiliac joint. A numbing medicine is injected into the joint. If the pain is improved or stopped, the diagnosis of sacroiliac joint dysfunction is more likely. TREATMENT  There are a number of types of treatment used for sacroiliac joint dysfunction, including:  Only  take over-the-counter or prescription medicines for pain, discomfort, or fever as directed by your caregiver.  Medications to relax muscles.  Rest. Decreasing activity can help cut down on painful muscle spasms and allow the back to heal.  Application of heat or ice to the lower back may improve muscle spasms and soothe pain.  Brace. A special back brace, called a sacroiliac belt, can help support the joint while your back is healing.  Physical therapy can help teach comfortable positions and exercises to strengthen muscles that support the sacroiliac joint.  Cortisone injections. Injections of steroid medicine into the joint can help decrease swelling and improve pain.  Hyaluronic acid injections. This chemical improves lubrication within the sacroiliac joint, thereby decreasing pain.  Radiofrequency ablation. A special needle is placed into the joint, where it burns away nerves that are carrying pain messages from the joint.  Surgery. Because pain occurs during movement of the joint, screws and plates may be installed in order to limit or prevent joint motion. HOME CARE INSTRUCTIONS   Take all medications exactly as directed.  Follow instructions regarding both rest and physical activity, to avoid worsening the pain.  Do physical therapy exercises exactly as prescribed. SEEK IMMEDIATE MEDICAL CARE IF:  You experience increasingly severe pain.  You develop new symptoms, such as numbness or tingling in your legs or feet.  You lose bladder or bowel control. Document Released: 01/20/2009 Document Revised: 01/16/2012 Document Reviewed: 01/20/2009 [facility] Patient Information 2015 [place], [place]. This information is not intended to replace advice given to you by your health care provider. Make sure you discuss  any questions you have with your health care provider.  

## 2016-07-24 ENCOUNTER — Encounter (HOSPITAL_COMMUNITY): Payer: Self-pay

## 2016-07-24 ENCOUNTER — Emergency Department (HOSPITAL_COMMUNITY)
Admission: EM | Admit: 2016-07-24 | Discharge: 2016-07-24 | Disposition: A | Discharge status: Home / Self Care | Payer: 59 | Attending: Emergency Medicine | Admitting: Emergency Medicine

## 2016-07-24 DIAGNOSIS — M5441 Lumbago with sciatica, right side: Secondary | ICD-10-CM | POA: Diagnosis not present

## 2016-07-24 DIAGNOSIS — Z79899 Other long term (current) drug therapy: Secondary | ICD-10-CM | POA: Diagnosis not present

## 2016-07-24 DIAGNOSIS — F17200 Nicotine dependence, unspecified, uncomplicated: Secondary | ICD-10-CM | POA: Diagnosis not present

## 2016-07-24 DIAGNOSIS — M545 Low back pain: Secondary | ICD-10-CM | POA: Diagnosis present

## 2016-07-24 MED ORDER — DEXAMETHASONE SODIUM PHOSPHATE 10 MG/ML IJ SOLN
10.0000 mg | Freq: Once | INTRAMUSCULAR | Status: AC
  Administered 2016-07-24: 10 mg via INTRAMUSCULAR
  Filled 2016-07-24: qty 1

## 2016-07-24 MED ORDER — HYDROCODONE-ACETAMINOPHEN 5-325 MG PO TABS: 1.0000 | ORAL_TABLET | ORAL | 0 refills | Status: DC | PRN

## 2016-07-24 MED ORDER — KETOROLAC TROMETHAMINE 60 MG/2ML IM SOLN
60.0000 mg | Freq: Once | INTRAMUSCULAR | Status: AC
  Administered 2016-07-24: 60 mg via INTRAMUSCULAR
  Filled 2016-07-24: qty 2

## 2016-07-24 MED ORDER — METHOCARBAMOL 500 MG PO TABS: 500.0000 mg | ORAL_TABLET | Freq: Every evening | ORAL | 0 refills | Status: DC | PRN

## 2016-07-24 NOTE — ED Triage Notes (Signed)
Left hip and leg pain for about 6 weeks worse since yesterday states pain is getting worse.

## 2016-07-24 NOTE — ED Provider Notes (Signed)
[facility]-EMERGENCY DEPT Provider Note   CSN: [id number] Arrival date & time: 07/24/16  0533     History   Chief Complaint Chief Complaint  Patient presents with  . Leg Pain    HPI Gabriel Norman is a 45 y.o. male who presents with low back pain with radicular symptoms. Past medical history significant for low back pain with sciatica which he attributes to a remote injury, prior neck surgery, prior L5-S1 discetomy in 2010. He states that he has had this back pain for "a long time" and has been told that he would need surgery again. 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 Unfortunately he lost his prior health insurance therefore he was unable to have surgery at that time. He has recently been employed again and has been traveling a lot within the past month and he he feels like his chronic back pain has gotten worse. The pain is in the low back and radiates down the right side to the toes. He reports associated decreased sensation on the right side. No fever, syncope, trauma, unexplained weight loss, hx of cancer, loss of bowel/bladder function, saddle anesthesia, urinary retention, IVDU.   HPI  Past Medical History:  Diagnosis Date  . Allergy   . Sciatica     There are no active problems to display for this patient.   Past Surgical History:  Procedure Laterality Date  . BACK SURGERY    . NECK SURGERY         Home Medications    Prior to Admission medications   Medication Sig Start Date End Date Taking? Authorizing Provider  HYDROcodone-acetaminophen (NORCO/VICODIN) 5-325 MG tablet Take 1-2 tablets by mouth every 4 (four) hours as needed. 07/24/16   Bethel BornKelly Marie Gekas, PA-C  ibuprofen (ADVIL,MOTRIN) 200 MG tablet Take 200 mg by mouth every 6 (six) hours as needed.    Historical Provider, MD  methocarbamol (ROBAXIN) 500 MG tablet Take 1  tablet (500 mg total) by mouth at bedtime and may repeat dose one time if needed. 07/24/16   Bethel BornKelly Marie Gekas, PA-C    Family History History reviewed. No pertinent family history.  Social History Social History  Substance Use Topics  . Smoking status: Current Every Day Smoker    Packs/day: 0.50  . Smokeless tobacco: Never Used  . Alcohol use 1.2 oz/week    2 Standard drinks or equivalent per week     Allergies   Review of patient's allergies indicates no known allergies.   Review of Systems Review of Systems  Constitutional: Negative for fever.  Musculoskeletal: Positive for back pain and gait problem.     Physical Exam Updated Vital Signs BP 125/86 (BP Location: Right Arm)   Pulse 96   Temp 97.6 F (36.4 C) (Oral)   Resp 18   Ht 5\' 9"  (1.753 m)   Wt 68 kg   SpO2 98%   BMI 22.15 kg/m   Physical Exam  Constitutional: He is oriented to person, place, and time. He appears well-developed and well-nourished. No distress.  Uncomfortable appearing  HENT:  Head: Normocephalic and atraumatic.  Eyes: Conjunctivae are normal. Pupils are equal, round, and reactive to light. Right eye exhibits no discharge. Left eye exhibits no discharge. No scleral icterus.  Neck: Normal range of motion. Neck supple.  Cardiovascular: Normal rate and regular rhythm.   No murmur heard. Pulmonary/Chest:  Effort normal and breath sounds normal. No respiratory distress.  Abdominal: Soft. He exhibits no distension. There is no tenderness.  Musculoskeletal: He exhibits no edema.  Inspection: No masses, deformity, or rash Palpation: Midline lumbar and sacral spinal tenderness. Right sided lumbar paraspinal muscle tenderness. ROM: Deferred Strength: 5/5 left lower extremity. 4/5 right lower extremity. Sensation: Reported decreased sensation  Gait: Antalgic gait Reflexes: Patellar reflex is 2+ on left, 1+ on right side, SLR: Positive seated straight leg raise bilaterally   Neurological: He is  alert and oriented to person, place, and time.  Skin: Skin is warm and dry.  Psychiatric: He has a normal mood and affect.  Nursing note and vitals reviewed.    ED Treatments / Results  Labs (all labs ordered are listed, but only abnormal results are displayed) Labs Reviewed - No data to display  EKG  EKG Interpretation None       Radiology No results found.  Procedures Procedures (including critical care time)  Medications Ordered in ED Medications  dexamethasone (DECADRON) injection 10 mg (10 mg Intramuscular Given 07/24/16 0858)  ketorolac (TORADOL) injection 60 mg (60 mg Intramuscular Given 07/24/16 0858)     Initial Impression / Assessment and Plan / ED Course  I have reviewed the triage vital signs and the nursing notes.  Pertinent labs & imaging results that were available during my care of the patient were reviewed by me and considered in my medical decision making (see chart for details).  Clinical Course   45 year old male presents with acute on chronic low back pain with right-sided sciatica. No red flag back pain signs and exam is reassuring. Steroid and NSAID shot given in ED. Rx for pain medicine and muscle relaxer given. Advised follow up with Ortho and continue NSAIDs at home. Patient is NAD, non-toxic, with stable VS. Patient is informed of clinical course, understands medical decision making process, and agrees with plan. Opportunity for questions provided and all questions answered. Return precautions given.  Final Clinical Impressions(s) / ED Diagnoses   Final diagnoses:  Right-sided low back pain with right-sided sciatica    New Prescriptions New Prescriptions   HYDROCODONE-ACETAMINOPHEN (NORCO/VICODIN) 5-325 MG TABLET    Take 1-2 tablets by mouth every 4 (four) hours as needed.   METHOCARBAMOL (ROBAXIN) 500 MG TABLET    Take 1 tablet (500 mg total) by mouth at bedtime and may repeat dose one time if needed.     Bethel Born, PA-C 07/24/16  1610    Maia Plan, MD 07/25/16 1000

## 2016-07-24 NOTE — ED Notes (Signed)
Bed: WA03 Expected date:  Expected time:  Means of arrival:  Comments: 

## 2016-08-23 ENCOUNTER — Ambulatory Visit (INDEPENDENT_AMBULATORY_CARE_PROVIDER_SITE_OTHER): Payer: Self-pay | Admitting: Orthopaedic Surgery

## 2017-07-03 ENCOUNTER — Encounter (HOSPITAL_COMMUNITY): Payer: Self-pay

## 2017-07-03 ENCOUNTER — Emergency Department (HOSPITAL_COMMUNITY)
Admission: EM | Admit: 2017-07-03 | Discharge: 2017-07-03 | Disposition: A | Discharge status: Home / Self Care | Payer: Worker's Compensation | Attending: Emergency Medicine | Admitting: Emergency Medicine

## 2017-07-03 DIAGNOSIS — R102 Pelvic and perineal pain: Secondary | ICD-10-CM

## 2017-07-03 DIAGNOSIS — M545 Low back pain: Secondary | ICD-10-CM | POA: Diagnosis not present

## 2017-07-03 DIAGNOSIS — M542 Cervicalgia: Secondary | ICD-10-CM | POA: Diagnosis not present

## 2017-07-03 DIAGNOSIS — R200 Anesthesia of skin: Secondary | ICD-10-CM | POA: Insufficient documentation

## 2017-07-03 DIAGNOSIS — Z79899 Other long term (current) drug therapy: Secondary | ICD-10-CM | POA: Diagnosis not present

## 2017-07-03 DIAGNOSIS — Z041 Encounter for examination and observation following transport accident: Secondary | ICD-10-CM | POA: Diagnosis present

## 2017-07-03 DIAGNOSIS — Z87891 Personal history of nicotine dependence: Secondary | ICD-10-CM | POA: Insufficient documentation

## 2017-07-03 DIAGNOSIS — M549 Dorsalgia, unspecified: Secondary | ICD-10-CM | POA: Diagnosis not present

## 2017-07-03 MED ORDER — PREDNISONE 20 MG PO TABS: 20.0000 mg | ORAL_TABLET | Freq: Two times a day (BID) | ORAL | 0 refills | Status: DC

## 2017-07-03 MED ORDER — HYDROCODONE-ACETAMINOPHEN 5-325 MG PO TABS: 1.0000 | ORAL_TABLET | ORAL | 0 refills | Status: DC | PRN

## 2017-07-03 MED FILL — predniSONE 20 MG TABS: 20 | 5 days supply | Qty: 10 | Fill #0

## 2017-07-03 MED FILL — HYDROCODON-APAP 5-325: 5-325 | 3 days supply | Qty: 15 | Fill #0
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
## 2017-07-03 NOTE — Discharge Instructions (Signed)
The cause of your discomfort is not clear.  It may improve with time and treatment.  If you do not improve it is important to follow-up with a specialist, in this case a spine physician, may be very helpful.  Do not drive or work when using the pain medication.  Try using heat on the sore areas 3 or 4 times a day to help with the discomfort.

## 2017-08-15 ENCOUNTER — Other Ambulatory Visit: Payer: Self-pay | Admitting: Neurosurgery

## 2017-08-16 NOTE — Pre-Procedure Instructions (Signed)
SHREYAN HINZ  08/16/2017      [facility] - [place], [place] - [address] [facility] [place] [address] Phone: [phone] Fax: [phone]  [facility] [address] ([address]), [place] - [address] [place] [address] Phone: [phone] Fax: [phone]    Your procedure is scheduled on August 18, 2017.  Report to [facility] Admitting at 07:00 A.M.  Call this number if you have problems the morning of surgery:  [phone]   Remember:  Do not eat food or drink liquids after midnight.   Take these medicines the morning of surgery with A SIP OF WATER Oxycodone pain pill if needed.  7 days prior to surgery STOP taking any Aspirin (unless otherwise instructed by your surgeon), Aleve, Naproxen, Ibuprofen, Motrin, Advil, Goody's, BC's, all herbal medications, fish oil, and all vitamins    Do not wear jewelry.  Do not wear lotions, powders, or perfumes, or deodorant.  Do not shave 48 hours prior to surgery.  Men may shave face and neck.  Do not bring valuables to the hospital.   [facility] is not responsible for any belongings or valuables.  Contacts, dentures or bridgework may not be worn into surgery.  Leave your suitcase in the car.  After surgery it may be brought to your room.  For patients admitted to the hospital, discharge time will be determined by your treatment team.  Patients discharged the day of surgery will not be allowed to drive home.   Special instructions:   [facility]- Preparing For Surgery  Before surgery, you can play an important role. Because skin is not sterile, your skin needs to be as free of germs as possible. You can reduce the number of germs on your skin by washing with CHG (chlorahexidine gluconate) Soap before surgery.  CHG is an antiseptic cleaner which kills germs and bonds with the skin to continue killing germs even after washing.  Please do not use if  you have an allergy to CHG or antibacterial soaps. If your skin becomes reddened/irritated stop using the CHG.  Do not shave (including legs and underarms) for at least 48 hours prior to first CHG shower. It is OK to shave your face.  Please follow these instructions carefully.   1. Shower the NIGHT BEFORE SURGERY and the MORNING OF SURGERY with CHG.   2. If you chose to wash your hair, wash your hair first as usual with your normal shampoo.  3. After you shampoo, rinse your hair and body thoroughly to remove the shampoo.  4. Use CHG as you would any other liquid soap. You can apply CHG directly to the skin and wash gently with a scrungie or a clean washcloth.   5. Apply the CHG Soap to your body ONLY FROM THE NECK DOWN.  Do not use on open wounds or open sores. Avoid contact with your eyes, ears, mouth and genitals (private parts). Wash genitals (private parts) with your normal soap.  6. Wash thoroughly, paying special attention to the area where your surgery will be performed.  7. Thoroughly rinse your body with warm water from the neck down.  8. DO NOT shower/wash with your normal soap after using and rinsing off the CHG Soap.  9. Pat yourself dry with a CLEAN TOWEL.  10. Wear CLEAN PAJAMAS to bed the night before surgery, wear comfortable clothes the morning of surgery  11. Place  CLEAN SHEETS on your bed the night of your first shower and DO NOT SLEEP WITH PETS.    Day of Surgery: Do not apply any deodorants/lotions. Please wear clean clothes to the hospital/surgery center.      Please read over the following fact sheets that you were given.

## 2017-08-17 ENCOUNTER — Encounter (HOSPITAL_COMMUNITY)
Admission: RE | Admit: 2017-08-17 | Discharge: 2017-08-17 | Disposition: A | Discharge status: Home / Self Care | Payer: Worker's Compensation | Source: Ambulatory Visit | Attending: Neurosurgery | Admitting: Neurosurgery

## 2017-08-17 ENCOUNTER — Encounter (HOSPITAL_COMMUNITY): Payer: Self-pay

## 2017-08-17 DIAGNOSIS — Z01812 Encounter for preprocedural laboratory examination: Secondary | ICD-10-CM | POA: Insufficient documentation

## 2017-08-17 HISTORY — DX: Other seasonal allergic rhinitis: J30.2

## 2017-08-17 LAB — CBC
HCT: 47.2 % (ref 39.0–52.0)
Hemoglobin: 16.2 g/dL (ref 13.0–17.0)
MCH: 29.9 pg (ref 26.0–34.0)
MCHC: 34.3 g/dL (ref 30.0–36.0)
MCV: 87.2 fL (ref 78.0–100.0)
Platelets: 221 10*3/uL (ref 150–400)
RBC: 5.41 MIL/uL (ref 4.22–5.81)
RDW: 15.4 % (ref 11.5–15.5)
WBC: 5.4 10*3/uL (ref 4.0–10.5)

## 2017-08-17 LAB — TYPE AND SCREEN
ABO/RH(D): B POS
Antibody Screen: NEGATIVE

## 2017-08-17 LAB — BASIC METABOLIC PANEL
Anion gap: 11 (ref 5–15)
BUN: <5 mg/dL — ABNORMAL LOW (ref 6–20)
CO2: 20 mmol/L — ABNORMAL LOW (ref 22–32)
Calcium: 9.6 mg/dL (ref 8.9–10.3)
Chloride: 106 mmol/L (ref 101–111)
Creatinine, Ser: 0.8 mg/dL (ref 0.61–1.24)
GFR calc Af Amer: >60 mL/min (ref >60)
GFR calc non Af Amer: >60 mL/min (ref >60)
Glucose, Bld: 93 mg/dL (ref 65–99)
Potassium: 4.1 mmol/L (ref 3.5–5.1)
Sodium: 137 mmol/L (ref 135–145)

## 2017-08-17 LAB — ABO/RH: ABO/RH(D): B POS

## 2017-08-17 LAB — SURGICAL PCR SCREEN
MRSA, PCR: NEGATIVE
Staphylococcus aureus: NEGATIVE

## 2017-08-17 NOTE — Progress Notes (Signed)
PCP - denies Cardiologist -denies   Chest x-ray - denies EKG - denies Stress Test -denies  ECHO - denies Cardiac Cath - denies    Patient denies shortness of breath, fever, cough and chest pain at PAT appointment   Patient verbalized understanding of instructions that were given to them at the PAT appointment. Patient was also instructed that they will need to review over the PAT instructions again at home before surgery.

## 2017-08-18 ENCOUNTER — Inpatient Hospital Stay (HOSPITAL_COMMUNITY)
Admission: RE | Admit: 2017-08-18 | Discharge: 2017-08-25 | DRG: 454 | Disposition: A | Discharge status: Home / Self Care | Payer: Worker's Compensation | Source: Ambulatory Visit | Attending: Neurosurgery | Admitting: Neurosurgery

## 2017-08-18 ENCOUNTER — Encounter (HOSPITAL_COMMUNITY)
Admission: RE | Disposition: A | Discharge status: Home / Self Care | Payer: Self-pay | Source: Ambulatory Visit | Attending: Neurosurgery

## 2017-08-18 ENCOUNTER — Inpatient Hospital Stay (HOSPITAL_COMMUNITY): Payer: Worker's Compensation | Admitting: Anesthesiology

## 2017-08-18 ENCOUNTER — Inpatient Hospital Stay (HOSPITAL_COMMUNITY): Payer: Worker's Compensation

## 2017-08-18 ENCOUNTER — Encounter (HOSPITAL_COMMUNITY): Payer: Self-pay | Admitting: General Practice

## 2017-08-18 DIAGNOSIS — M5116 Intervertebral disc disorders with radiculopathy, lumbar region: Principal | ICD-10-CM | POA: Diagnosis present

## 2017-08-18 DIAGNOSIS — Y92410 Unspecified street and highway as the place of occurrence of the external cause: Secondary | ICD-10-CM | POA: Diagnosis not present

## 2017-08-18 DIAGNOSIS — J9811 Atelectasis: Secondary | ICD-10-CM | POA: Diagnosis not present

## 2017-08-18 DIAGNOSIS — Z87891 Personal history of nicotine dependence: Secondary | ICD-10-CM | POA: Diagnosis not present

## 2017-08-18 DIAGNOSIS — R509 Fever, unspecified: Secondary | ICD-10-CM | POA: Diagnosis not present

## 2017-08-18 DIAGNOSIS — M4726 Other spondylosis with radiculopathy, lumbar region: Secondary | ICD-10-CM | POA: Diagnosis present

## 2017-08-18 DIAGNOSIS — M5126 Other intervertebral disc displacement, lumbar region: Secondary | ICD-10-CM | POA: Diagnosis present

## 2017-08-18 DIAGNOSIS — M542 Cervicalgia: Secondary | ICD-10-CM | POA: Diagnosis present

## 2017-08-18 DIAGNOSIS — M5441 Lumbago with sciatica, right side: Secondary | ICD-10-CM

## 2017-08-18 DIAGNOSIS — J189 Pneumonia, unspecified organism: Secondary | ICD-10-CM

## 2017-08-18 SURGERY — POSTERIOR LUMBAR FUSION 1 LEVEL
Anesthesia: General

## 2017-08-18 MED ORDER — MIDAZOLAM HCL 2 MG/2ML IJ SOLN
INTRAMUSCULAR | Status: DC | PRN
  Administered 2017-08-18: 2 mg via INTRAVENOUS

## 2017-08-18 MED ORDER — ONDANSETRON HCL 4 MG PO TABS: 4.0000 mg | ORAL_TABLET | Freq: Four times a day (QID) | ORAL | Status: DC | PRN

## 2017-08-18 MED ORDER — ROCURONIUM BROMIDE 50 MG/5ML IV SOLN
INTRAVENOUS | Status: AC
  Filled 2017-08-18: qty 1

## 2017-08-18 MED ORDER — POTASSIUM CHLORIDE IN NACL 20-0.9 MEQ/L-% IV SOLN
INTRAVENOUS | Status: DC
  Administered 2017-08-18: 22:00:00 via INTRAVENOUS
  Filled 2017-08-18: qty 1000

## 2017-08-18 MED ORDER — ONDANSETRON HCL 4 MG/2ML IJ SOLN
4.0000 mg | Freq: Four times a day (QID) | INTRAMUSCULAR | Status: DC | PRN
  Filled 2017-08-18: qty 2

## 2017-08-18 MED ORDER — FENTANYL CITRATE (PF) 100 MCG/2ML IJ SOLN
INTRAMUSCULAR | Status: DC | PRN
  Administered 2017-08-18 (×2): 50 ug via INTRAVENOUS
  Administered 2017-08-18: 100 ug via INTRAVENOUS
  Administered 2017-08-18 (×2): 50 ug via INTRAVENOUS

## 2017-08-18 MED ORDER — SODIUM CHLORIDE 0.9 % IV SOLN: 250.0000 mL | INTRAVENOUS | Status: DC

## 2017-08-18 MED ORDER — CEFAZOLIN SODIUM-DEXTROSE 2-3 GM-% IV SOLR
INTRAVENOUS | Status: DC | PRN
  Administered 2017-08-18: 2 g via INTRAVENOUS

## 2017-08-18 MED ORDER — PHENOL 1.4 % MT LIQD: 1.0000 | OROMUCOSAL | Status: DC | PRN

## 2017-08-18 MED ORDER — FENTANYL CITRATE (PF) 100 MCG/2ML IJ SOLN
25.0000 ug | INTRAMUSCULAR | Status: DC | PRN
  Administered 2017-08-18 (×3): 50 ug via INTRAVENOUS

## 2017-08-18 MED ORDER — PROPOFOL 10 MG/ML IV BOLUS
INTRAVENOUS | Status: AC
  Filled 2017-08-18: qty 20

## 2017-08-18 MED ORDER — DOCUSATE SODIUM 100 MG PO CAPS
100.0000 mg | ORAL_CAPSULE | Freq: Two times a day (BID) | ORAL | Status: DC
  Administered 2017-08-18 – 2017-08-25 (×14): 100 mg via ORAL
  Filled 2017-08-18 (×14): qty 1

## 2017-08-18 MED ORDER — OXYCODONE HCL 5 MG PO TABS
ORAL_TABLET | ORAL | Status: AC
  Filled 2017-08-18: qty 2

## 2017-08-18 MED ORDER — ACETAMINOPHEN 650 MG RE SUPP: 650.0000 mg | RECTAL | Status: DC | PRN

## 2017-08-18 MED ORDER — BUPIVACAINE HCL 0.5 % IJ SOLN
INTRAMUSCULAR | Status: DC | PRN
  Administered 2017-08-18: 30 mL

## 2017-08-18 MED ORDER — MORPHINE SULFATE (PF) 4 MG/ML IV SOLN
INTRAVENOUS | Status: AC
  Administered 2017-08-18: 2 mg
  Filled 2017-08-18: qty 1

## 2017-08-18 MED ORDER — FENTANYL CITRATE (PF) 100 MCG/2ML IJ SOLN
INTRAMUSCULAR | Status: AC
  Administered 2017-08-18: 50 ug via INTRAVENOUS
  Filled 2017-08-18: qty 2

## 2017-08-18 MED ORDER — DIAZEPAM 5 MG PO TABS
ORAL_TABLET | ORAL | Status: AC
Start: 2017-08-18 — End: 2017-08-19
  Filled 2017-08-18: qty 1

## 2017-08-18 MED ORDER — SODIUM CHLORIDE 0.9% FLUSH
3.0000 mL | INTRAVENOUS | Status: DC | PRN
  Administered 2017-08-19: 3 mL via INTRAVENOUS
  Filled 2017-08-18: qty 3

## 2017-08-18 MED ORDER — ALBUMIN HUMAN 5 % IV SOLN
INTRAVENOUS | Status: DC | PRN
  Administered 2017-08-18: 10:00:00 via INTRAVENOUS

## 2017-08-18 MED ORDER — MENTHOL 3 MG MT LOZG: 1.0000 | LOZENGE | OROMUCOSAL | Status: DC | PRN

## 2017-08-18 MED ORDER — FENTANYL CITRATE (PF) 100 MCG/2ML IJ SOLN: 25.0000 ug | INTRAMUSCULAR | Status: DC | PRN

## 2017-08-18 MED ORDER — ACETAMINOPHEN 325 MG PO TABS
650.0000 mg | ORAL_TABLET | ORAL | Status: DC | PRN
  Administered 2017-08-20 – 2017-08-24 (×7): 650 mg via ORAL
  Filled 2017-08-18 (×7): qty 2

## 2017-08-18 MED ORDER — ONDANSETRON HCL 4 MG/2ML IJ SOLN
INTRAMUSCULAR | Status: AC
  Filled 2017-08-18: qty 2

## 2017-08-18 MED ORDER — OXYCODONE HCL 5 MG PO TABS
5.0000 mg | ORAL_TABLET | ORAL | Status: DC | PRN
  Administered 2017-08-18 – 2017-08-24 (×26): 10 mg via ORAL
  Filled 2017-08-18 (×27): qty 2

## 2017-08-18 MED ORDER — LIDOCAINE 2% (20 MG/ML) 5 ML SYRINGE
INTRAMUSCULAR | Status: AC
  Filled 2017-08-18: qty 5

## 2017-08-18 MED ORDER — OXYCODONE HCL ER 10 MG PO T12A
10.0000 mg | EXTENDED_RELEASE_TABLET | Freq: Two times a day (BID) | ORAL | Status: DC
  Administered 2017-08-18 – 2017-08-21 (×6): 10 mg via ORAL
  Filled 2017-08-18 (×6): qty 1

## 2017-08-18 MED ORDER — ONDANSETRON HCL 4 MG/2ML IJ SOLN: 4.0000 mg | Freq: Once | INTRAMUSCULAR | Status: DC | PRN

## 2017-08-18 MED ORDER — LACTATED RINGERS IV SOLN
INTRAVENOUS | Status: DC
  Administered 2017-08-18 (×4): via INTRAVENOUS

## 2017-08-18 MED ORDER — ZOLPIDEM TARTRATE 5 MG PO TABS: 5.0000 mg | ORAL_TABLET | Freq: Every evening | ORAL | Status: DC | PRN

## 2017-08-18 MED ORDER — LIDOCAINE-EPINEPHRINE 0.5 %-1:200000 IJ SOLN
INTRAMUSCULAR | Status: AC
  Filled 2017-08-18: qty 1

## 2017-08-18 MED ORDER — 0.9 % SODIUM CHLORIDE (POUR BTL) OPTIME
TOPICAL | Status: DC | PRN
  Administered 2017-08-18: 1000 mL

## 2017-08-18 MED ORDER — SODIUM CHLORIDE 0.9% FLUSH
3.0000 mL | Freq: Two times a day (BID) | INTRAVENOUS | Status: DC
  Administered 2017-08-18 – 2017-08-24 (×11): 3 mL via INTRAVENOUS

## 2017-08-18 MED ORDER — FENTANYL CITRATE (PF) 250 MCG/5ML IJ SOLN
INTRAMUSCULAR | Status: AC
  Filled 2017-08-18: qty 5

## 2017-08-18 MED ORDER — DIAZEPAM 5 MG PO TABS
5.0000 mg | ORAL_TABLET | Freq: Four times a day (QID) | ORAL | Status: DC | PRN
  Administered 2017-08-18 – 2017-08-23 (×14): 5 mg via ORAL
  Filled 2017-08-18 (×15): qty 1

## 2017-08-18 MED ORDER — SUGAMMADEX SODIUM 200 MG/2ML IV SOLN
INTRAVENOUS | Status: DC | PRN
  Administered 2017-08-18: 200 mg via INTRAVENOUS

## 2017-08-18 MED ORDER — SENNOSIDES-DOCUSATE SODIUM 8.6-50 MG PO TABS: 1.0000 | ORAL_TABLET | Freq: Every evening | ORAL | Status: DC | PRN

## 2017-08-18 MED ORDER — THROMBIN 20000 UNITS EX SOLR
CUTANEOUS | Status: AC
  Filled 2017-08-18: qty 20000

## 2017-08-18 MED ORDER — OXYCODONE HCL 5 MG/5ML PO SOLN: 5.0000 mg | Freq: Once | ORAL | Status: AC | PRN

## 2017-08-18 MED ORDER — DEXAMETHASONE SODIUM PHOSPHATE 10 MG/ML IJ SOLN
INTRAMUSCULAR | Status: AC
  Filled 2017-08-18: qty 1

## 2017-08-18 MED ORDER — ONDANSETRON HCL 4 MG/2ML IJ SOLN
INTRAMUSCULAR | Status: DC | PRN
  Administered 2017-08-18: 4 mg via INTRAVENOUS

## 2017-08-18 MED ORDER — FENTANYL CITRATE (PF) 100 MCG/2ML IJ SOLN
INTRAMUSCULAR | Status: AC
  Filled 2017-08-18: qty 2

## 2017-08-18 MED ORDER — CEFAZOLIN SODIUM-DEXTROSE 2-4 GM/100ML-% IV SOLN
INTRAVENOUS | Status: AC
  Filled 2017-08-18: qty 100

## 2017-08-18 MED ORDER — OXYCODONE HCL 5 MG PO TABS
5.0000 mg | ORAL_TABLET | Freq: Once | ORAL | Status: AC | PRN
  Administered 2017-08-18: 5 mg via ORAL

## 2017-08-18 MED ORDER — CELECOXIB 200 MG PO CAPS
200.0000 mg | ORAL_CAPSULE | Freq: Two times a day (BID) | ORAL | Status: DC
  Administered 2017-08-18 – 2017-08-19 (×2): 200 mg via ORAL
  Filled 2017-08-18 (×2): qty 1

## 2017-08-18 MED ORDER — BUPIVACAINE HCL (PF) 0.5 % IJ SOLN
INTRAMUSCULAR | Status: AC
  Filled 2017-08-18: qty 30

## 2017-08-18 MED ORDER — GELATIN ABSORBABLE MT POWD
OROMUCOSAL | Status: DC | PRN
  Administered 2017-08-18: 09:00:00 via TOPICAL

## 2017-08-18 MED ORDER — MIDAZOLAM HCL 2 MG/2ML IJ SOLN
INTRAMUSCULAR | Status: AC
  Filled 2017-08-18: qty 2

## 2017-08-18 MED ORDER — CEFAZOLIN SODIUM-DEXTROSE 2-4 GM/100ML-% IV SOLN
2.0000 g | INTRAVENOUS | Status: DC
Start: 2017-08-19 — End: 2017-08-18

## 2017-08-18 MED ORDER — ROCURONIUM BROMIDE 50 MG/5ML IV SOSY
PREFILLED_SYRINGE | INTRAVENOUS | Status: DC | PRN
  Administered 2017-08-18 (×4): 10 mg via INTRAVENOUS
  Administered 2017-08-18: 50 mg via INTRAVENOUS
  Administered 2017-08-18: 10 mg via INTRAVENOUS

## 2017-08-18 MED ORDER — LIDOCAINE 2% (20 MG/ML) 5 ML SYRINGE
INTRAMUSCULAR | Status: DC | PRN
  Administered 2017-08-18: 60 mg via INTRAVENOUS

## 2017-08-18 MED ORDER — MAGNESIUM CITRATE PO SOLN: 1.0000 | Freq: Once | ORAL | Status: DC | PRN

## 2017-08-18 MED ORDER — DEXAMETHASONE SODIUM PHOSPHATE 10 MG/ML IJ SOLN
INTRAMUSCULAR | Status: DC | PRN
  Administered 2017-08-18: 10 mg via INTRAVENOUS

## 2017-08-18 MED ORDER — GABAPENTIN 300 MG PO CAPS
300.0000 mg | ORAL_CAPSULE | Freq: Three times a day (TID) | ORAL | Status: DC
  Administered 2017-08-18 – 2017-08-25 (×21): 300 mg via ORAL
  Filled 2017-08-18 (×21): qty 1

## 2017-08-18 MED ORDER — PROPOFOL 10 MG/ML IV BOLUS
INTRAVENOUS | Status: DC | PRN
  Administered 2017-08-18: 180 mg via INTRAVENOUS

## 2017-08-18 MED ORDER — MORPHINE SULFATE (PF) 2 MG/ML IV SOLN
2.0000 mg | INTRAVENOUS | Status: DC | PRN
  Administered 2017-08-18 – 2017-08-21 (×11): 2 mg via INTRAVENOUS
  Filled 2017-08-18 (×12): qty 1

## 2017-08-18 MED ORDER — BISACODYL 5 MG PO TBEC
5.0000 mg | DELAYED_RELEASE_TABLET | Freq: Every day | ORAL | Status: DC | PRN
  Administered 2017-08-21: 5 mg via ORAL
  Filled 2017-08-18: qty 1

## 2017-08-18 SURGICAL SUPPLY — 67 items
ADH SKN CLS APL DERMABOND .7 (GAUZE/BANDAGES/DRESSINGS) ×1
APL SKNCLS STERI-STRIP NONHPOA (GAUZE/BANDAGES/DRESSINGS)
BAG URINE DRAINAGE (UROLOGICAL SUPPLIES) ×1 IMPLANT
BENZOIN TINCTURE PRP APPL 2/3 (GAUZE/BANDAGES/DRESSINGS) IMPLANT
BLADE CLIPPER SURG (BLADE) IMPLANT
BONE VIVIGEN FORMABLE 5.4CC (Bone Implant) ×2 IMPLANT
BUR MATCHSTICK NEURO 3.0 LAGG (BURR) ×2 IMPLANT
BUR PRECISION FLUTE 5.0 (BURR) ×2 IMPLANT
CAGE CONCORDE LIFT 9X21 (Cage) ×2 IMPLANT
CANISTER SUCT 3000ML PPV (MISCELLANEOUS) ×2 IMPLANT
CAP RELINE MOD TULIP RMM (Cap) ×4 IMPLANT
CARTRIDGE OIL MAESTRO DRILL (MISCELLANEOUS) ×1 IMPLANT
CONT SPEC 4OZ CLIKSEAL STRL BL (MISCELLANEOUS) ×2 IMPLANT
COVER BACK TABLE 60X90IN (DRAPES) ×2 IMPLANT
DECANTER SPIKE VIAL GLASS SM (MISCELLANEOUS) ×2 IMPLANT
DERMABOND ADVANCED (GAUZE/BANDAGES/DRESSINGS) ×1
DERMABOND ADVANCED .7 DNX12 (GAUZE/BANDAGES/DRESSINGS) ×1 IMPLANT
DIFFUSER DRILL AIR PNEUMATIC (MISCELLANEOUS) ×2 IMPLANT
DRAPE C-ARM 42X72 X-RAY (DRAPES) ×3 IMPLANT
DRAPE C-ARMOR (DRAPES) ×1 IMPLANT
DRAPE LAPAROTOMY 100X72X124 (DRAPES) ×2 IMPLANT
DRAPE POUCH INSTRU U-SHP 10X18 (DRAPES) ×2 IMPLANT
DRAPE SURG 17X23 STRL (DRAPES) ×2 IMPLANT
DRSG OPSITE POSTOP 4X6 (GAUZE/BANDAGES/DRESSINGS) ×1 IMPLANT
DURAPREP 26ML APPLICATOR (WOUND CARE) ×2 IMPLANT
ELECT REM PT RETURN 9FT ADLT (ELECTROSURGICAL) ×2
ELECTRODE REM PT RTRN 9FT ADLT (ELECTROSURGICAL) ×1 IMPLANT
GAUZE SPONGE 4X4 12PLY STRL (GAUZE/BANDAGES/DRESSINGS) IMPLANT
GAUZE SPONGE 4X4 16PLY XRAY LF (GAUZE/BANDAGES/DRESSINGS) IMPLANT
GLOVE ECLIPSE 6.5 STRL STRAW (GLOVE) ×4 IMPLANT
GLOVE EXAM NITRILE LRG STRL (GLOVE) IMPLANT
GLOVE EXAM NITRILE XL STR (GLOVE) IMPLANT
GLOVE EXAM NITRILE XS STR PU (GLOVE) IMPLANT
GOWN STRL REUS W/ TWL LRG LVL3 (GOWN DISPOSABLE) ×2 IMPLANT
GOWN STRL REUS W/ TWL XL LVL3 (GOWN DISPOSABLE) IMPLANT
GOWN STRL REUS W/TWL 2XL LVL3 (GOWN DISPOSABLE) IMPLANT
GOWN STRL REUS W/TWL LRG LVL3 (GOWN DISPOSABLE) ×4
GOWN STRL REUS W/TWL XL LVL3 (GOWN DISPOSABLE)
GRAFT BNE MATRIX VG FRMBL MD 5 (Bone Implant) IMPLANT
KIT BASIN OR (CUSTOM PROCEDURE TRAY) ×2 IMPLANT
KIT POSITION SURG JACKSON T1 (MISCELLANEOUS) ×2 IMPLANT
KIT ROOM TURNOVER OR (KITS) ×2 IMPLANT
NDL HYPO 25X1 1.5 SAFETY (NEEDLE) ×1 IMPLANT
NDL SPNL 18GX3.5 QUINCKE PK (NEEDLE) IMPLANT
NEEDLE HYPO 25X1 1.5 SAFETY (NEEDLE) ×4 IMPLANT
NEEDLE SPNL 18GX3.5 QUINCKE PK (NEEDLE) IMPLANT
NS IRRIG 1000ML POUR BTL (IV SOLUTION) ×2 IMPLANT
OIL CARTRIDGE MAESTRO DRILL (MISCELLANEOUS) ×2
PACK LAMINECTOMY NEURO (CUSTOM PROCEDURE TRAY) ×2 IMPLANT
PAD ARMBOARD 7.5X6 YLW CONV (MISCELLANEOUS) ×4 IMPLANT
ROD RELINE COCR LORD 5.0X35 (Rod) ×1 IMPLANT
ROD RELINE COCR LORD 5X30 (Rod) ×1 IMPLANT
SCREW LOCK RSS 4.5/5.0MM (Screw) ×4 IMPLANT
SCREW SHANK RELINE MOD 6.5X35 (Screw) ×4 IMPLANT
SPONGE LAP 4X18 X RAY DECT (DISPOSABLE) IMPLANT
SPONGE SURGIFOAM ABS GEL 100 (HEMOSTASIS) ×2 IMPLANT
STRIP CLOSURE SKIN 1/2X4 (GAUZE/BANDAGES/DRESSINGS) IMPLANT
SUT PROLENE 6 0 BV (SUTURE) IMPLANT
SUT VIC AB 0 CT1 18XCR BRD8 (SUTURE) ×1 IMPLANT
SUT VIC AB 0 CT1 8-18 (SUTURE) ×2
SUT VIC AB 2-0 CT1 18 (SUTURE) ×2 IMPLANT
SUT VIC AB 3-0 SH 8-18 (SUTURE) ×3 IMPLANT
SYR CONTROL 10ML LL (SYRINGE) ×2 IMPLANT
TOWEL GREEN STERILE (TOWEL DISPOSABLE) ×2 IMPLANT
TOWEL GREEN STERILE FF (TOWEL DISPOSABLE) ×2 IMPLANT
TRAY FOLEY W/METER SILVER 16FR (SET/KITS/TRAYS/PACK) ×2 IMPLANT
WATER STERILE IRR 1000ML POUR (IV SOLUTION) ×2 IMPLANT

## 2017-08-18 NOTE — Progress Notes (Addendum)
Pt reports pain medication is not effective for pain management., Patient got very upset and agitated hitting his hand and the call light violently on the bed side table when the nurse told him his pain medication was not due. Nurse tried to explain to patient and patient said, " when I call for my nurse and the nurse can not come in immediately, I need a new nurse. I have been waiting for almost 30 min". Nurse told pt to please calm down but patient was not willing to cooperate. pt's spous apologized for patients behavior and asked nurse to please ask the doctor for pain medication. Nurse reassured  Spouse that a note will be left to report incidence and to notify MD of patients pain
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
## 2017-08-18 NOTE — Anesthesia Postprocedure Evaluation (Signed)
Anesthesia Post Note  Patient: Gabriel Norman  Procedure(s) Performed: POSTERIOR LUMBAR INTERBODY FUSION LUMBAR 5- SACRAL 1 USING PEDICLE SREWS AND RODS (N/A )     Patient location during evaluation: PACU Anesthesia Type: General Level of consciousness: awake, awake and alert and oriented Pain management: pain level controlled Vital Signs Assessment: post-procedure vital signs reviewed and stable Respiratory status: spontaneous breathing, nonlabored ventilation and respiratory function stable Cardiovascular status: blood pressure returned to baseline Anesthetic complications: no    Last Vitals:  Vitals:   08/18/17 1247 08/18/17 1255  BP:  122/87  Pulse:  84  Resp:  15  Temp:    SpO2: 93% 100%    Last Pain:  Vitals:   08/18/17 1255  TempSrc:   PainSc: 10-Worst pain ever                 Nelida Mandarino COKER

## 2017-08-18 NOTE — Anesthesia Procedure Notes (Signed)
Procedure Name: Intubation Date/Time: 08/18/2017 8:40 AM Performed by: Pearson Grippe Pre-anesthesia Checklist: Patient identified, Emergency Drugs available, Suction available and Patient being monitored Patient Re-evaluated:Patient Re-evaluated prior to induction Oxygen Delivery Method: Circle system utilized Preoxygenation: Pre-oxygenation with 100% oxygen Induction Type: IV induction Ventilation: Mask ventilation without difficulty Laryngoscope Size: Glidescope and 3 Grade View: Grade I Tube type: Oral Tube size: 7.5 mm Number of attempts: 1 Airway Equipment and Method: Stylet and Oral airway Placement Confirmation: ETT inserted through vocal cords under direct vision,  positive ETCO2 and breath sounds checked- equal and bilateral Secured at: 22 cm Tube secured with: Tape Dental Injury: Teeth and Oropharynx as per pre-operative assessment

## 2017-08-18 NOTE — Anesthesia Preprocedure Evaluation (Signed)

## 2017-08-18 NOTE — Op Note (Signed)
08/18/2017  1:25 PM  PATIENT:  Gabriel Norman  46 y.o. male with a recurrent disc herniation and foraminal stenosis  PRE-OPERATIVE DIAGNOSIS:  LUMBAGO WITH SCIATICA, RIGHT SIDE L5/s1, recurrent disc herniation L5/S1 right  POST-OPERATIVE DIAGNOSIS:  same PROCEDURE:  Procedure(s): POSTERIOR LUMBAR INTERBODY FUSION LUMBAR 5- SACRAL 1 , auto graft morsels, synthes expandable plif cages x2 Posterolateral arthrodesis Non segmental pedicle screw fixation , nuvasive relign hardware  SURGEON:  Surgeon(s): Coletta Memos, MD  ASSISTANTS:none  ANESTHESIA:   general  EBL:  Total I/O In: 2750 [I.V.:2500; IV Piggyback:250] Out: 1450 [Urine:1100; Blood:350]  BLOOD ADMINISTERED:none  CELL SAVER GIVEN:none  COUNT:per nursing  DRAINS: none   SPECIMEN:  No Specimen  DICTATION: Gabriel Norman is a 46 y.o. male whom was taken to the operating room intubated, and placed under a general anesthetic without difficulty. A foley catheter was placed under sterile conditions. He was positioned prone on a Jackson stable with all pressure points properly padded.  His lumbar region was prepped and draped in a sterile manner.I opened the skin with a 10 blade and took the incision down to the thoracolumbar fascia. I exposed the lamina of L4,5,and S1 in a subperiosteal fashion bilaterally. I confirmed my location with an intraoperative xray.  I placed self retaining retractors and started the decompression.  I decompressed the spinal canal via a complete inferior facetectomy of L5 bilaterally. This allowed for a complete decompression of the of the L5 roots bilaterally. I used the drill and the Kerrison punches to remove the bone and ligament. I performed two semilaminectomies on the right and left sides. I decompressed the spinal canal and the S1 roots also.I removed bone far in excess of the needed exposure for a plif.  PLIF's were performed at L5/S1 in the same fashion. I opened the disc space with a 15 blade then  used a variety of instruments to remove the disc and prepare the space for the arthrodesis. I used curettes, rongeurs, punches, shavers for the disc space, and rasps in the discetomy. I measured the disc space and placed expandable(Synthes) into the disc space(s). I also packed the cages with auto graft morsels. I expanded the cages under fluoroscopic guidance.  I decorticated the lateral bone at L5 and S1 with the drill. I then placed allograft and autograft on the decorticated surfaces to complete the posterolateral arthrodesis.  I placed pedicle screws at L5 and S1, using fluoroscopic guidance. I drilled a pilot hole, then cannulated the pedicle with a drill at each site. I then tapped each pedicle, assessing each site for pedicle violations. No cutouts were appreciated. Screws (Nuvasive relign) were then placed at each site without difficulty. I attached rods and locking caps with the appropriate tools. The locking caps were secured with torque limited screwdrivers. Final films were performed and the final construct appeared to be in good position.  I closed the wound in a layered fashion. I approximated the thoracolumbar fascia, subcutaneous, and subcuticular planes with vicryl sutures. I used dermabond, and an occlusive bandage for a sterile dressing.     PLAN OF CARE: Admit to inpatient   PATIENT DISPOSITION:  PACU - hemodynamically stable.   Delay start of Pharmacological VTE agent (>24hrs) due to surgical blood loss or risk of bleeding:  yes

## 2017-08-18 NOTE — Transfer of Care (Signed)
Immediate Anesthesia Transfer of Care Note  Patient: Gabriel Norman  Procedure(s) Performed: POSTERIOR LUMBAR INTERBODY FUSION LUMBAR 5- SACRAL 1 USING PEDICLE SREWS AND RODS (N/A )  Patient Location: PACU  Anesthesia Type:General  Level of Consciousness: drowsy  Airway & Oxygen Therapy: Patient Spontanous Breathing and Patient connected to face mask oxygen  Post-op Assessment: Report given to RN and Post -op Vital signs reviewed and stable  Post vital signs: Reviewed and stable  Last Vitals:  Vitals:   08/18/17 0716  BP: 115/89  Pulse: 76  Resp: 20  Temp: 36.7 C  SpO2: 98%    Last Pain:  Vitals:   08/18/17 0734  TempSrc:   PainSc: 8       Patients Stated Pain Goal: 3 (08/18/17 0734)  Complications: No apparent anesthesia complications

## 2017-08-19 MED ORDER — KETOROLAC TROMETHAMINE 30 MG/ML IJ SOLN
30.0000 mg | Freq: Three times a day (TID) | INTRAMUSCULAR | Status: AC
  Administered 2017-08-19 – 2017-08-20 (×3): 30 mg via INTRAVENOUS
  Filled 2017-08-19 (×3): qty 1

## 2017-08-19 NOTE — Progress Notes (Signed)
Patiently currently asleep.  Will delay taking vital signs for patient to sleep a while. Lack of sleep was a complaint patient had earlier. Will continue to monitor.

## 2017-08-19 NOTE — Evaluation (Signed)
Occupational Therapy Evaluation Patient Details Name: Gabriel Norman MRN: [id number] DOB: [birth year] Today's Date: 08/19/2017    History of Present Illness Pt is a 46 y/o male s/p PLIF L5-S1. No pertinent PMH.   Clinical Impression   PTA Pt independent in ADL and mobility, working full time, driving, active. Pt is currently max A for LB ADL and min A +2 for stand pivot transfer with RW. Pt very impacted by pain currently. Pt will benefit from skilled OT in the acute setting prior to dc home with HHOT and 24 hour support from his wife and family to maximize safety and independence in ADL. Next session to [facility] back handout and educated on AE for LB, continue work on functional transfers and make sure that Pt is comfortable with don/doff brace.     Follow Up Recommendations  Home health OT;Supervision/Assistance - 24 hour    Equipment Recommendations  3 in 1 bedside commode    Recommendations for Other Services       Precautions / Restrictions Precautions Precautions: Back;Fall Precaution Comments: Reviewed 3/3 back precautions and log roll technique with pt and pt's spouse Required Braces or Orthoses: Spinal Brace Spinal Brace: Lumbar corset;Applied in sitting position Restrictions Weight Bearing Restrictions: No      Mobility Bed Mobility Overal bed mobility: Needs Assistance Bed Mobility: Rolling;Sidelying to Sit Rolling: Min guard Sidelying to sit: Mod assist       General bed mobility comments: increased time and effort, use of bed rails, cueing for technique, assist to elevate trunk to achieve sitting EOB  Transfers Overall transfer level: Needs assistance Equipment used: Rolling walker (2 wheeled) Transfers: Sit to/from [employer] Sit to Stand: Min assist;+2 safety/equipment Stand pivot transfers: Min assist;+2 safety/equipment       General transfer comment: increased time and effort, pt with preference for pulling with bilateral hands on RW to  achieve standing with therapist stabilizing RW.     Balance Overall balance assessment: Needs assistance Sitting-balance support: Feet supported Sitting balance-Leahy Scale: Fair     Standing balance support: During functional activity;Bilateral upper extremity supported Standing balance-Leahy Scale: Poor Standing balance comment: reliant on bilateral UEs on RW                           ADL either performed or assessed with clinical judgement   ADL Overall ADL's : Needs assistance/impaired Eating/Feeding: Modified independent;Sitting   Grooming: Set up;Sitting   Upper Body Bathing: Moderate assistance   Lower Body Bathing: Maximal assistance   Upper Body Dressing : Sitting;Moderate assistance Upper Body Dressing Details (indicate cue type and reason): to don brace Lower Body Dressing: Maximal assistance Lower Body Dressing Details (indicate cue type and reason): total A for donning socks, Pt would be able to assist with pull up of pants Toilet Transfer: Minimal assistance;+2 for safety/equipment;Stand-pivot;BSC;RW Toilet Transfer Details (indicate cue type and reason): simulated through recliner transfer Toileting- Clothing Manipulation and Hygiene: Maximal assistance       Functional mobility during ADLs: Minimal assistance;+2 for safety/equipment;Rolling walker General ADL Comments: Pt severely limited by pain during session, educated on BLT back precautions especially in reference for ADL     Vision Patient Visual Report: No change from baseline       Perception     Praxis      Pertinent Vitals/Pain Pain Assessment: 0-10 Pain Score: 10-Worst pain ever Pain Location: back and R LE Pain Descriptors / Indicators: Sore;Grimacing;Crying Pain Intervention(s): Monitored  during session;Premedicated before session;Repositioned;Utilized relaxation techniques (breathing techniques)     Hand Dominance Right   Extremity/Trunk Assessment Upper Extremity  Assessment Upper Extremity Assessment: Overall WFL for tasks assessed   Lower Extremity Assessment Lower Extremity Assessment: Defer to PT evaluation   Cervical / Trunk Assessment Cervical / Trunk Assessment: Other exceptions Cervical / Trunk Exceptions: s/p lumbar sx   Communication Communication Communication: No difficulties   Cognition Arousal/Alertness: Awake/alert Behavior During Therapy: WFL for tasks assessed/performed Overall Cognitive Status: Within Functional Limits for tasks assessed                                     General Comments  wife present for entire session    Exercises     Shoulder Instructions      Home Living Family/patient expects to be discharged to:: Private residence Living Arrangements: Spouse/significant other Available Help at Discharge: Family;Available 24 hours/day Type of Home: House Home Access: Stairs to enter [employer] of Steps: 3 Entrance Stairs-Rails: None Home Layout: One level     Bathroom Shower/Tub: Chief Strategy Officer: Standard     Home Equipment: Crutches          Prior Functioning/Environment Level of Independence: Independent        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 Problem List: Decreased range of motion;Decreased activity tolerance;Impaired balance (sitting and/or standing);Decreased knowledge of use of DME or AE;Decreased knowledge of precautions;Pain      OT Treatment/Interventions: Self-care/ADL training;DME and/or AE instruction;Therapeutic activities;Patient/family education;Balance training    OT Goals(Current goals can be found in the care plan section) Acute Rehab OT Goals Patient Stated Goal: decrease pain OT Goal Formulation: With patient Time For Goal Achievement: 09/02/17 Potential to Achieve Goals: Good ADL Goals Pt Will Perform Upper Body Bathing: with supervision;with adaptive equipment;with caregiver independent in  assisting;sitting Pt Will Perform Lower Body Bathing: with supervision;with caregiver independent in assisting;with adaptive equipment;sitting/lateral leans Pt Will Perform Upper Body Dressing: with supervision;with caregiver independent in assisting;sitting Pt Will Perform Lower Body Dressing: with min assist;with caregiver independent in assisting;with adaptive equipment;sit to/from stand Pt Will Transfer to Toilet: with supervision;stand pivot transfer;bedside commode (with RW) Pt Will Perform Toileting - Clothing Manipulation and hygiene: with supervision;sit to/from stand;with adaptive equipment Additional ADL Goal #1: Pt will recall 3/3 back precautions and maintain during ADL activity with less than 2 verbal cues Additional ADL Goal #2: Pt will perform bed mobility at supervision level prior to initiating ADL activity  OT Frequency: Min 3X/week   Barriers to D/C:            Co-evaluation PT/OT/SLP Co-Evaluation/Treatment: Yes Reason for Co-Treatment: For patient/therapist safety;To address functional/ADL transfers;Other (comment) (for tolerance of therapy) PT goals addressed during session: Mobility/safety with mobility;Balance;Proper use of DME;Strengthening/ROM OT goals addressed during session: ADL's and self-care      AM-PAC PT "6 Clicks" Daily Activity     Outcome Measure Help from another person eating meals?: None Help from another person taking care of personal grooming?: A Little Help from another person toileting, which includes using toliet, bedpan, or urinal?: A Lot Help from another person bathing (including washing, rinsing, drying)?: A Lot Help from another person to put on and taking off regular upper body clothing?: A Lot Help from another person to put on and taking off regular lower body clothing?: A Lot 6  Click Score: 15   End of Session Equipment Utilized During Treatment: Gait belt;Rolling walker;Back brace Nurse Communication: Mobility status  Activity  Tolerance: Patient limited by pain Patient left: in chair;with call bell/phone within reach;with chair alarm set;with family/visitor present (therapy contact on the board for questions/concerns)  OT Visit Diagnosis: Unsteadiness on feet (R26.81);Other abnormalities of gait and mobility (R26.89);Pain Pain - Right/Left: Right (central) Pain - part of body: Leg (back)                Time: 1610-9604 OT Time Calculation (min): 35 min Charges:  OT General Charges $OT Visit: 1 Visit OT Evaluation $OT Eval Moderate Complexity: 1 Mod G-Codes:     Sherryl Manges OTR/L [phone] [employer] Jamiyah Dingley 08/19/2017, 3:08 PM

## 2017-08-19 NOTE — Progress Notes (Signed)
Orthopedic Tech Progress Note Patient Details:  Gabriel Norman [birth year] [id number]  Patient ID: [name], male   DOB: [birth year], 46 y.o.   MRN: [id number]   [name] 08/19/2017, 10:00 AM Called in bio-tech brace order; spoke with Reita Cliche

## 2017-08-19 NOTE — Evaluation (Addendum)
Physical Therapy Evaluation Patient Details Name: Gabriel Norman MRN: [id number] DOB: 11/16/1970 Today's Date: 08/19/2017   History of Present Illness  Pt is a 46 y/o male s/p PLIF L5-S1. No pertinent PMH.  Clinical Impression  Pt presented supine in bed with HOB elevated, awake and willing to participate in therapy session. Pt's spouse present throughout session. Pt's RN administering pain meds prior to initiation of evaluation. Prior to admission, pt reported that he was independent with all functional mobility and ADLs. Pt currently very limited secondary to 10/10 pain in his back. Pt tolerated bed mobility with mod A and transfers with min A x2 with use of RW. Therapist reviewed 3/3 back precautions and log roll technique with pt throughout. Pt would continue to benefit from skilled physical therapy services at this time while admitted and after d/c to address the below listed limitations in order to improve overall safety and independence with functional mobility.     Follow Up Recommendations Home health PT;Supervision/Assistance - 24 hour    Equipment Recommendations  Rolling walker with 5" wheels;3in1 (PT)    Recommendations for Other Services       Precautions / Restrictions Precautions Precautions: Back;Fall Precaution Comments: Reviewed 3/3 back precautions and log roll technique with pt and pt's spouse Required Braces or Orthoses: Spinal Brace Spinal Brace: Lumbar corset;Applied in sitting position Restrictions Weight Bearing Restrictions: No      Mobility  Bed Mobility Overal bed mobility: Needs Assistance Bed Mobility: Rolling;Sidelying to Sit Rolling: Min guard Sidelying to sit: Mod assist       General bed mobility comments: increased time and effort, use of bed rails, cueing for technique, assist to elevate trunk to achieve sitting EOB  Transfers Overall transfer level: Needs assistance Equipment used: Rolling walker (2 wheeled) Transfers: Sit to/from  [employer] Sit to Stand: Min assist;+2 safety/equipment Stand pivot transfers: Min assist;+2 safety/equipment       General transfer comment: increased time and effort, pt with preference for pulling with bilateral hands on RW to achieve standing with therapist stabilizing RW.   Ambulation/Gait             General Gait Details: pt only able to take pivotal steps from the bed to the chair secondary to significant pain (pt in tears with standing)  Stairs            Wheelchair Mobility    Modified Rankin (Stroke Patients Only)       Balance Overall balance assessment: Needs assistance Sitting-balance support: Feet supported Sitting balance-Leahy Scale: Fair     Standing balance support: During functional activity;Bilateral upper extremity supported Standing balance-Leahy Scale: Poor Standing balance comment: reliant on bilateral UEs on RW                             Pertinent Vitals/Pain Pain Assessment: 0-10 Pain Score: 10-Worst pain ever Pain Location: back and R LE Pain Descriptors / Indicators: Sore;Grimacing Pain Intervention(s): Monitored during session;Repositioned;Premedicated before session;Patient requesting pain meds-RN notified    Home Living Family/patient expects to be discharged to:: Private residence Living Arrangements: Spouse/significant other Available Help at Discharge: Family;Available 24 hours/day Type of Home: House Home Access: Stairs to enter Entrance Stairs-Rails: None Entrance Stairs-Number of Steps: 3 Home Layout: One level Home Equipment: Crutches      Prior Function Level of Independence: Independent         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  Hand Dominance   Dominant Hand: Right    Extremity/Trunk Assessment   Upper Extremity Assessment Upper Extremity Assessment: Defer to OT evaluation    Lower Extremity Assessment Lower Extremity Assessment: Overall WFL for  tasks assessed    Cervical / Trunk Assessment Cervical / Trunk Assessment: Other exceptions Cervical / Trunk Exceptions: s/p lumbar sx  Communication   Communication: No difficulties  Cognition Arousal/Alertness: Awake/alert Behavior During Therapy: WFL for tasks assessed/performed Overall Cognitive Status: Within Functional Limits for tasks assessed                                        General Comments      Exercises     Assessment/Plan    PT Assessment Patient needs continued PT services  PT Problem List Decreased strength;Decreased activity tolerance;Decreased balance;Decreased coordination;Decreased mobility;Decreased knowledge of use of DME;Decreased safety awareness;Decreased knowledge of precautions;Pain       PT Treatment Interventions DME instruction;Gait training;Stair training;Functional mobility training;Therapeutic activities;Therapeutic exercise;Balance training;Neuromuscular re-education;Patient/family education    PT Goals (Current goals can be found in the Care Plan section)  Acute Rehab PT Goals Patient Stated Goal: decrease pain PT Goal Formulation: With patient/family Time For Goal Achievement: 09/02/17 Potential to Achieve Goals: Good    Frequency Min 5X/week   Barriers to discharge        Co-evaluation PT/OT/SLP Co-Evaluation/Treatment: Yes Reason for Co-Treatment: For patient/therapist safety;To address functional/ADL transfers PT goals addressed during session: Mobility/safety with mobility;Balance;Proper use of DME;Strengthening/ROM         AM-PAC PT "6 Clicks" Daily Activity  Outcome Measure Difficulty turning over in bed (including adjusting bedclothes, sheets and blankets)?: A Lot Difficulty moving from lying on back to sitting on the side of the bed? : Unable Difficulty sitting down on and standing up from a chair with arms (e.g., wheelchair, bedside commode, etc,.)?: Unable Help needed moving to and from a bed to  chair (including a wheelchair)?: A Little Help needed walking in hospital room?: A Little Help needed climbing 3-5 steps with a railing? : A Lot 6 Click Score: 12    End of Session Equipment Utilized During Treatment: Gait belt;Back brace Activity Tolerance: Patient limited by pain Patient left: in chair;with call bell/phone within reach;with family/visitor present Nurse Communication: Mobility status;Patient requests pain meds PT Visit Diagnosis: Other abnormalities of gait and mobility (R26.89);Pain Pain - part of body:  (back)    Time: 4098-1191 PT Time Calculation (min) (ACUTE ONLY): 51 min   Charges:   PT Evaluation $PT Eval Moderate Complexity: 1 Mod PT Treatments $Therapeutic Activity: 8-22 mins   PT G Codes:        [place], PT, DPT [phone]   Alessandra Bevels Roosevelt Eimers 08/19/2017, 1:56 PM

## 2017-08-19 NOTE — Progress Notes (Signed)
Patient ID: Gabriel Norman, male   DOB: [birth year], 46 y.o.   MRN: [id number] Subjective: Patient reports back pain, no leg pain or NTW, has not been OOB yet  Objective: Vital signs in last 24 hours: Temp:  [97.9 F (36.6 C)-98.9 F (37.2 C)] 98.9 F (37.2 C) (10/13 0520) Pulse Rate:  [67-111] 100 (10/13 0520) Resp:  [10-24] 18 (10/13 0520) BP: (100-126)/(61-90) 116/79 (10/13 0520) SpO2:  [93 %-100 %] 97 % (10/13 0520)  Intake/Output from previous day: 10/12 0701 - 10/13 0700 In: 3453 [I.V.:3203; IV Piggyback:250] Out: 5400 [Urine:5050; Blood:350] Intake/Output this shift: No intake/output data recorded.  Neurologic: Grossly normal  Lab Results: Lab Results  Component Value Date   WBC 5.4 08/17/2017   HGB 16.2 08/17/2017   HCT 47.2 08/17/2017   MCV 87.2 08/17/2017   PLT 221 08/17/2017   Lab Results  Component Value Date   INR 0.95 08/17/2009   BMET Lab Results  Component Value Date   NA 137 08/17/2017   K 4.1 08/17/2017   CL 106 08/17/2017   CO2 20 (L) 08/17/2017   GLUCOSE 93 08/17/2017   BUN <5 (L) 08/17/2017   CREATININE 0.80 08/17/2017   CALCIUM 9.6 08/17/2017    Studies/Results: Dg Lumbar Spine 2-3 Views  Result Date: 08/18/2017 CLINICAL DATA:  L5-S1 fusion. EXAM: DG C-ARM 61-120 MIN; LUMBAR SPINE - 2-3 VIEW COMPARISON:  MRI 04/25/2014 FINDINGS: Pedicle screws in good position at L5 and S1 without complicating features. Interbody fusion device in good position. IMPRESSION: Well positioned fusion hardware at L5-S1. Electronically Signed   By: Rudie Meyer M.D.   On: 08/18/2017 12:14   Dg C-arm 1-60 Min  Result Date: 08/18/2017 CLINICAL DATA:  L5-S1 fusion. EXAM: DG C-ARM 61-120 MIN; LUMBAR SPINE - 2-3 VIEW COMPARISON:  MRI 04/25/2014 FINDINGS: Pedicle screws in good position at L5 and S1 without complicating features. Interbody fusion device in good position. IMPRESSION: Well positioned fusion hardware at L5-S1. Electronically Signed   By: Rudie Meyer M.D.    On: 08/18/2017 12:14    Assessment/Plan: Mobilize today, pain control   LOS: 1 day    Thanh Pomerleau S 08/19/2017, 10:06 AM

## 2017-08-19 NOTE — Progress Notes (Signed)
Received report on patient at bedside. Patient states "it is time for my medicine, but I want to eat first"  Family is in the room. Will monitor and maintain safety.

## 2017-08-19 NOTE — Plan of Care (Signed)
Problem: Pain Managment: Goal: General experience of comfort will improve Outcome: Progressing PRN morphine and oxycodone in place Patient educated and aware he can ask for both intermittently.

## 2017-08-19 NOTE — Progress Notes (Signed)
Medicated patient with oxycodone and valium.  Explained the importance of using the incentive sp. Wife at bedside, very supportive.  Gave emotional support to patient and family member. Will continue to monitor.

## 2017-08-20 NOTE — Progress Notes (Signed)
Occupational Therapy Treatment Patient Details Name: Gabriel Norman MRN: [id number] DOB: 11/04/1971 Today's Date: 08/20/2017    History of present illness Pt is a 46 y/o male s/p PLIF L5-S1. No pertinent PMH.   OT comments  Pt. And wife were given back protocol sheet and were educated on content. Pt. And wife were educated on use of AE for ADLs. Pt. Was able to return demo with max A. Pt. Was given pain medicine and was lethargic during session. Discussed 3-1 commode vs. Toilet riser with wife. They are going to decide tomorrow on which they perfer. Pt. 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 Pt. Wife is going to contact their insurance company caseworker to obtain hip hit. Acute OT to follow.   Follow Up Recommendations  Home health OT    Equipment Recommendations   (discussed 3-1 vs. toilet riser.)    Recommendations for Other Services      Precautions / Restrictions Precautions Precautions: Back;Fall Precaution Comments: Reviewed 3/3 back precautions and log roll technique with pt and pt's spouse Required Braces or Orthoses: Spinal Brace Spinal Brace: Lumbar corset;Applied in sitting position       Mobility Bed Mobility Overal bed mobility: Needs Assistance                Transfers       Sit to Stand: Min assist Stand pivot transfers: Min assist       General transfer comment:  (increased time.)    Balance                                           ADL either performed or assessed with clinical judgement   ADL                         Lower Body Dressing Details (indicate cue type and reason): Max A with use of AE. Toilet Transfer: Minimal assistance             General ADL Comments: Pt. was educated on use of AE for LE ADLs. Pt. was lethargic and needs additional training. Went over handout with Pt. and wife.      Vision       Perception     Praxis      Cognition Arousal/Alertness: Lethargic;Suspect due to  medications Behavior During Therapy: WFL for tasks assessed/performed Overall Cognitive Status: Within Functional Limits for tasks assessed                                          Exercises     Shoulder Instructions       General Comments      Pertinent Vitals/ Pain       Pain Score: 8  (Pt. rated pain 8 with toileting and 0 at rest.) Pain Location:  (back) Pain Descriptors / Indicators: Aching Pain Intervention(s): Monitored during session;Premedicated before session  Home Living                                          Prior Functioning/Environment              Frequency  Progress Toward Goals  OT Goals(current goals can now be found in the care plan section)  Progress towards OT goals: Progressing toward goals  Acute Rehab OT Goals Patient Stated Goal: go home OT Goal Formulation: With patient  Plan      Co-evaluation                 AM-PAC PT "6 Clicks" Daily Activity     Outcome Measure   Help from another person eating meals?: None Help from another person taking care of personal grooming?: A Little Help from another person toileting, which includes using toliet, bedpan, or urinal?: A Lot Help from another person bathing (including washing, rinsing, drying)?: A Lot Help from another person to put on and taking off regular upper body clothing?: A Lot Help from another person to put on and taking off regular lower body clothing?: A Lot 6 Click Score: 15    End of Session    OT Visit Diagnosis: Unsteadiness on feet (R26.81);Other abnormalities of gait and mobility (R26.89);Pain   Activity Tolerance     Patient Left in bed;with call bell/phone within reach;with family/visitor present   Nurse Communication  (discussed pain medicine)        Time: 2550-0164 OT Time Calculation (min): 40 min  Charges: OT General Charges $OT Visit: 1 Visit OT Treatments $Self Care/Home Management : 29-03  mins  6 clicks   Rajanee Schuelke 08/20/2017, 10:36 AM

## 2017-08-20 NOTE — Progress Notes (Signed)
Patient assisted to toilet using rolling walker.  Patient demonstrated applying aspen brace.  Able to be assisted back into bed where patient moved self in position in bed.  Honeycomb dressing to back clean, dry and intact. Patient encouraged to walk in hall, patient declined at this time.  Educated on use of incentive spirometer.

## 2017-08-20 NOTE — Progress Notes (Signed)
Physical Therapy Treatment Patient Details Name: Gabriel Norman MRN: [id number] DOB: 1971/11/01 Today's Date: 08/20/2017    History of Present Illness Pt is a 46 y/o male s/p PLIF L5-S1. PMH cervical surgery.    PT Comments    Pt. ambulated down hallway c increased time 2/2 pain.  Pt was determined to make it to the window and back despite pain, grimacing, and tears.  Follow Up Recommendations  Home health PT;Supervision/Assistance - 24 hour     Equipment Recommendations  Rolling walker with 5" wheels;3in1 (PT)    Recommendations for Other Services       Precautions / Restrictions Precautions Precautions: Back;Fall Precaution Comments: Reviewed 3/3 back precautions and log roll technique with pt and pt's spouse Required Braces or Orthoses: Spinal Brace Spinal Brace: Lumbar corset;Applied in sitting position Restrictions Weight Bearing Restrictions: No    Mobility  Bed Mobility Overal bed mobility: Modified Independent Bed Mobility: Rolling;Sidelying to Sit Rolling: Modified independent (Device/Increase time);Supervision Sidelying to sit: Modified independent (Device/Increase time);Supervision       General bed mobility comments: increased time and effort, use of bed rails.  Transfers Overall transfer level: Modified independent Equipment used: Rolling walker (2 wheeled) Transfers: Sit to/from Stand Sit to Stand: Modified independent (Device/Increase time);Supervision        General transfer comment: increased time and effot, pt used R hand on bed to push up and L hand on RW while PT stabilized RW.  Ambulation/Gait Ambulation/Gait assistance: Modified independent (Device/Increase time);Min guard;Supervision Ambulation Distance (Feet): 50 Feet Assistive device: Rolling walker (2 wheeled) Gait Pattern/deviations: Decreased stride length     General Gait Details: pt waked short distance down hallway, took muliptle standing breaks, moving at a very slow pace 2/2  pain (10/10)                    Balance Overall balance assessment: Needs assistance Sitting-balance support: Feet supported Sitting balance-Leahy Scale: Fair     Standing balance support: During functional activity;Bilateral upper extremity supported Standing balance-Leahy Scale: Poor Standing balance comment: reliant on bilateral UEs on RW                            Cognition Arousal/Alertness: Awake/alert Behavior During Therapy: WFL for tasks assessed/performed Overall Cognitive Status: Within Functional Limits for tasks assessed                                        Exercises      General Comments General comments (skin integrity, edema, etc.): wife present for entire session      Pertinent Vitals/Pain Pain Assessment: 0-10 Pain Score: 9  Pain Location: back and R LE Pain Descriptors / Indicators: Moaning;Grimacing;Crying;Aching Pain Intervention(s): Monitored during session    Home Living                      Prior Function            PT Goals (current goals can now be found in the care plan section) Acute Rehab PT Goals Patient Stated Goal: go home PT Goal Formulation: With patient/family Time For Goal Achievement: 09/02/17 Potential to Achieve Goals: Good Progress towards PT goals: Progressing toward goals    Frequency    Min 5X/week      PT Plan Current plan remains appropriate    Co-evaluation  AM-PAC PT "6 Clicks" Daily Activity  Outcome Measure  Difficulty turning over in bed (including adjusting bedclothes, sheets and blankets)?: A Little Difficulty moving from lying on back to sitting on the side of the bed? : A Lot Difficulty sitting down on and standing up from a chair with arms (e.g., wheelchair, bedside commode, etc,.)?: A Lot Help needed moving to and from a bed to chair (including a wheelchair)?: A Little Help needed walking in hospital room?: A Little Help needed  climbing 3-5 steps with a railing? : A Lot 6 Click Score: 15    End of Session Equipment Utilized During Treatment: Gait belt;Back brace;Other (comment) (RW) Activity Tolerance: Patient limited by pain Patient left: in chair;with call bell/phone within reach;with family/visitor present   PT Visit Diagnosis: Other abnormalities of gait and mobility (R26.89);Pain Pain - part of body:  (end of treatmetn 10/10 back, 7/10 neck and shoulders )     Time: 4098-1191 PT Time Calculation (min) (ACUTE ONLY): 31 min  Charges:  $Gait Training: 23-37 mins                    G CodesNeomia Norman, Gabriel Norman [phone] office   Gabriel Norman 08/20/2017, 1:31 PM

## 2017-08-20 NOTE — Progress Notes (Signed)
Patient ID: Gabriel Norman, male   DOB: 1970/12/25, 46 y.o.   MRN: [id number] Pt up walking with walker this am, c/o back discomfort, no leg pain, continue to mobilize today, continue pain control

## 2017-08-21 ENCOUNTER — Inpatient Hospital Stay (HOSPITAL_COMMUNITY): Payer: Worker's Compensation

## 2017-08-21 LAB — URINALYSIS, ROUTINE W REFLEX MICROSCOPIC
Bilirubin Urine: NEGATIVE
Glucose, UA: NEGATIVE mg/dL
Hgb urine dipstick: NEGATIVE
Ketones, ur: NEGATIVE mg/dL
Leukocytes, UA: NEGATIVE
Nitrite: NEGATIVE
Protein, ur: NEGATIVE mg/dL
Specific Gravity, Urine: 1.005 (ref 1.005–1.030)
pH: 7 (ref 5.0–8.0)

## 2017-08-21 LAB — CBC WITH DIFFERENTIAL/PLATELET
Basophils Absolute: 0 10*3/uL (ref 0.0–0.1)
Basophils Relative: 0 %
Eosinophils Absolute: 0.3 10*3/uL (ref 0.0–0.7)
Eosinophils Relative: 3 %
HCT: 38.5 % — ABNORMAL LOW (ref 39.0–52.0)
Hemoglobin: 12.9 g/dL — ABNORMAL LOW (ref 13.0–17.0)
Lymphocytes Relative: 18 %
Lymphs Abs: 1.7 10*3/uL (ref 0.7–4.0)
MCH: 29.8 pg (ref 26.0–34.0)
MCHC: 33.5 g/dL (ref 30.0–36.0)
MCV: 88.9 fL (ref 78.0–100.0)
Monocytes Absolute: 1 10*3/uL (ref 0.1–1.0)
Monocytes Relative: 10 %
Neutro Abs: 6.8 10*3/uL (ref 1.7–7.7)
Neutrophils Relative %: 69 %
Platelets: 192 10*3/uL (ref 150–400)
RBC: 4.33 MIL/uL (ref 4.22–5.81)
RDW: 14.8 % (ref 11.5–15.5)
WBC: 9.8 10*3/uL (ref 4.0–10.5)

## 2017-08-21 MED ORDER — OXYCODONE HCL ER 15 MG PO T12A
15.0000 mg | EXTENDED_RELEASE_TABLET | Freq: Two times a day (BID) | ORAL | Status: DC
  Administered 2017-08-21 – 2017-08-25 (×8): 15 mg via ORAL
  Filled 2017-08-21 (×8): qty 1

## 2017-08-21 NOTE — Care Management Note (Signed)
Case Management Note  Patient Details  Name: Gabriel Norman MRN: [id number] Date of Birth: 06-02-1971  Subjective/Objective:                    Action/Plan: Plan is for patient to d/c home with [facility] services and DME. CM spoke to patients Workers Comp CMEulah Citizen Dewitt: [phone] about ordered DME. Stevie asked that we obtain DME through usual vendor and they will pay them for the equipment. Jermaine with [facility] DME notified and will f/u with patients needed DME. CM following for HH orders.   Expected Discharge Date:                  Expected Discharge Plan:  Home w Home Health Services  In-House Referral:     Discharge planning Services  CM Consult  Post Acute Care Choice:  Durable Medical Equipment, Home Health Choice offered to:  Patient, Spouse  DME Arranged:  3-N-1, Walker rolling DME Agency:  [facility]  HH Arranged:    HH Agency:     Status of Service:  In process, will continue to follow  If discussed at Long Length of Stay Meetings, dates discussed:    Additional Comments:  Kermit Balo, RN 08/21/2017, 12:26 PM

## 2017-08-21 NOTE — Progress Notes (Signed)
Patient ID: Gabriel Norman, male   DOB: 1971/08/06, 46 y.o.   MRN: [id number] BP 128/90 (BP Location: Left Arm)   Pulse (!) 110   Temp (!) 101.7 F (38.7 C) (Oral)   Resp 16   Ht  (1.753 m)   Wt 78.6 kg (173 lb 4.8 oz)   SpO2 97%   BMI 25.59 kg/m  Alert and oriented x 4 Moving better, still with pain restrictions on effort Wound is clean

## 2017-08-21 NOTE — Progress Notes (Signed)
Physical Therapy Treatment Patient Details Name: Gabriel Norman MRN: [id number] DOB: 06-Apr-1971 Today's Date: 08/21/2017    History of Present Illness Pt is a 46 y/o male s/p PLIF L5-S1. PMH cervical surgery.    PT Comments    Pt reports he has more pain than yesterday, however, continuing to improve mobility and independence.  We discussed the need for stair training prior to d/c home as he has 3 STE with no rails.    Follow Up Recommendations  Home health PT;Supervision for mobility/OOB     Equipment Recommendations  Rolling walker with 5" wheels;3in1 (PT);Other (comment) (bed rail for home use)    Recommendations for Other Services   NA     Precautions / Restrictions Precautions Precautions: Back;Fall Precaution Comments: Reviewed 3/3 back precautions and log roll technique with pt and pt's spouse Required Braces or Orthoses: Spinal Brace Spinal Brace: Lumbar corset;Applied in sitting position    Mobility  Bed Mobility Overal bed mobility: Needs Assistance Bed Mobility: Rolling;Sidelying to Sit Rolling: Supervision Sidelying to sit: Supervision       General bed mobility comments: supervision for safety  Transfers Overall transfer level: Needs assistance Equipment used: Rolling walker (2 wheeled) Transfers: Sit to/from Stand Sit to Stand: Min guard         General transfer comment: Min guard assist for safety due to pt unable to put hands on bed to push up to stand, relying on both hands on RW to stand over flexed knees slowly, so PT had to stabilize RW.    Ambulation/Gait Ambulation/Gait assistance: Supervision Ambulation Distance (Feet): 55 Feet Assistive device: Rolling walker (2 wheeled) Gait Pattern/deviations: Step-through pattern Gait velocity: decreased Gait velocity interpretation: <1.8 ft/sec, indicative of risk for recurrent falls General Gait Details: Very slow gait, guarded due to pain.  Pt tearful during our session.  Good upright posture  despite heavy reliance on RW use   Stairs Stairs:  (discussed the need to practice stairs tomorrow)                 Balance Overall balance assessment: Needs assistance Sitting-balance support: Feet supported;Bilateral upper extremity supported Sitting balance-Leahy Scale: Fair     Standing balance support: Bilateral upper extremity supported Standing balance-Leahy Scale: Poor Standing balance comment: heavy reliance on bil UE support on RW instanding.                             Cognition Arousal/Alertness: Awake/alert Behavior During Therapy: WFL for tasks assessed/performed Overall Cognitive Status: Within Functional Limits for tasks assessed                                               Pertinent Vitals/Pain Pain Assessment: Faces Faces Pain Scale: Hurts worst Pain Location: back and bil legs Pain Descriptors / Indicators: Grimacing;Guarding Pain Intervention(s): Limited activity within patient's tolerance;Monitored during session;Repositioned           PT Goals (current goals can now be found in the care plan section) Acute Rehab PT Goals Patient Stated Goal: go home Progress towards PT goals: Progressing toward goals    Frequency    Min 5X/week      PT Plan Current plan remains appropriate       AM-PAC PT "6 Clicks" Daily Activity  Outcome Measure  Difficulty turning over in bed (  including adjusting bedclothes, sheets and blankets)?: A Little Difficulty moving from lying on back to sitting on the side of the bed? : A Little Difficulty sitting down on and standing up from a chair with arms (e.g., wheelchair, bedside commode, etc,.)?: Unable Help needed moving to and from a bed to chair (including a wheelchair)?: A Little Help needed walking in hospital room?: A Little Help needed climbing 3-5 steps with a railing? : A Lot 6 Click Score: 15    End of Session Equipment Utilized During Treatment: Gait belt;Back  brace Activity Tolerance: Patient limited by pain Patient left: in chair;with call bell/phone within reach;with family/visitor present   PT Visit Diagnosis: Other abnormalities of gait and mobility (R26.89);Pain Pain - Right/Left:  (lower) Pain - part of body:  (back)     Time: 0981-1914 PT Time Calculation (min) (ACUTE ONLY): 34 min  Charges:  $Gait Training: 23-37 mins          Margrete Delude B. Zina Pitzer, PT, DPT [phone]            08/21/2017, 3:15 PM

## 2017-08-21 NOTE — Progress Notes (Signed)
OT Cancellation    08/21/17 1700  OT Visit Information  Last OT Received On 08/21/17  Reason Eval/Treat Not Completed Patient declined, no reason specified. Wife and pt in room upon arrival and RN providing pain medication. Pt stating "I don't mean to be rude, but I have been working all day and cooperated all day. I am tired and in pain and don't feel like doing therapy right now. Thank you." Will return tomorrow to continue education on LB ADLs to increase carry over to home.     Cayce Quezada MSOT, OTR/L Acute Rehab Pager: [phone] Office: [phone]

## 2017-08-22 MED ORDER — CHLORPROMAZINE HCL 25 MG PO TABS
25.0000 mg | ORAL_TABLET | Freq: Three times a day (TID) | ORAL | Status: DC
  Administered 2017-08-22 – 2017-08-23 (×3): 25 mg via ORAL
  Filled 2017-08-22 (×4): qty 1

## 2017-08-22 NOTE — Progress Notes (Signed)
Physical Therapy Treatment Patient Details Name: Gabriel Norman MRN: [id number] DOB: [birth year] Today's Date: 08/22/2017    History of Present Illness Pt is a 46 y/o male s/p PLIF L5-S1. PMH cervical surgery.    PT Comments    Pt's treatment limited by pain. Pt tearful during transfer and stated he is not up for ambulation today. Advised pt he will need to negotiate stairs with PT before d/c home. Will attempt tomorrow.   Follow Up Recommendations  Home health PT;Supervision for mobility/OOB     Equipment Recommendations  Rolling walker with 5" wheels;3in1 (PT);Other (comment) (bed rail for home use)    Recommendations for Other Services       Precautions / Restrictions Precautions Precautions: Back;Fall Precaution Comments: Pt able to recall 3/3 precautions Required Braces or Orthoses: Spinal Brace Spinal Brace: Lumbar corset;Applied in sitting position Restrictions Weight Bearing Restrictions: No    Mobility  Bed Mobility Overal bed mobility: Needs Assistance Bed Mobility: Rolling;Sidelying to Sit Rolling: Supervision Sidelying to sit: Supervision       General bed mobility comments: supervision for safety  Transfers Overall transfer level: Needs assistance Equipment used: Rolling walker (2 wheeled) Transfers: Stand Pivot Transfers;Sit to/from Stand Sit to Stand: Min assist Stand pivot transfers: Min guard       General transfer comment: min assist to power up into standing secondary to pain.   Ambulation/Gait             General Gait Details: Not attempted today. Pt very tearful with pain.   Stairs            Wheelchair Mobility    Modified Rankin (Stroke Patients Only)       Balance Overall balance assessment: Needs assistance Sitting-balance support: Feet supported;Bilateral upper extremity supported Sitting balance-Leahy Scale: Fair     Standing balance support: Bilateral upper extremity supported Standing balance-Leahy Scale:  Poor Standing balance comment: heavy reliance on bil UE support on RW instanding.                             Cognition Arousal/Alertness: Awake/alert Behavior During Therapy: WFL for tasks assessed/performed Overall Cognitive Status: Within Functional Limits for tasks assessed                                        Exercises      General Comments General comments (skin integrity, edema, etc.): pt independent with donning back brace      Pertinent Vitals/Pain Pain Assessment: Faces Faces Pain Scale: Hurts worst Pain Location: back  Pain Descriptors / Indicators: Grimacing;Guarding;Crying Pain Intervention(s): Monitored during session;Limited activity within patient's tolerance    Home Living                      Prior Function            PT Goals (current goals can now be found in the care plan section) Acute Rehab PT Goals Patient Stated Goal: go home PT Goal Formulation: With patient/family Time For Goal Achievement: 09/02/17 Potential to Achieve Goals: Good Progress towards PT goals: Progressing toward goals    Frequency    Min 5X/week      PT Plan Current plan remains appropriate    Co-evaluation              AM-PAC PT "6 Clicks" Daily  Activity  Outcome Measure  Difficulty turning over in bed (including adjusting bedclothes, sheets and blankets)?: A Little Difficulty moving from lying on back to sitting on the side of the bed? : A Little Difficulty sitting down on and standing up from a chair with arms (e.g., wheelchair, bedside commode, etc,.)?: Unable Help needed moving to and from a bed to chair (including a wheelchair)?: A Little Help needed walking in hospital room?: A Little Help needed climbing 3-5 steps with a railing? : A Lot 6 Click Score: 15    End of Session Equipment Utilized During Treatment: Gait belt;Back brace Activity Tolerance: Patient limited by pain Patient left: in chair;with call  bell/phone within reach Nurse Communication: Mobility status;Patient requests pain meds PT Visit Diagnosis: Other abnormalities of gait and mobility (R26.89);Pain Pain - Right/Left:  (lower) Pain - part of body:  (back)     Time: 1324-4010 PT Time Calculation (min) (ACUTE ONLY): 21 min  Charges:  $Therapeutic Activity: 8-22 mins                    G Codes:      Kallie Locks, [place] Pager [phone] Acute Rehab   Sheral Apley 08/22/2017, 12:17 PM

## 2017-08-22 NOTE — Care Management Note (Signed)
Case Management Note  Patient Details  Name: [name] MRN: [id number] Date of Birth: [birth year]  Subjective/Objective:                    Action/Plan: CM faxed HH orders to patients Workmans Comp CM Stevie Dewitt at: [phone] with confirmation that it sent. CM continuing to follow.  Expected Discharge Date:                  Expected Discharge Plan:  Home w Home Health Services  In-House Referral:     Discharge planning Services  CM Consult  Post Acute Care Choice:  Durable Medical Equipment, Home Health Choice offered to:  Patient, Spouse  DME Arranged:  3-N-1, Walker rolling DME Agency:  [facility]  HH Arranged:    HH Agency:     Status of Service:  In process, will continue to follow  If discussed at Long Length of Stay Meetings, dates discussed:    Additional Comments:  Kermit Balo, RN 08/22/2017, 11:41 AM

## 2017-08-22 NOTE — Progress Notes (Signed)
Pt ambulated in the hallway with rolling walker, brace on and aligned. Gait steady. No noted distress. Pain medication was administered prior to ambulation. Pt stated that ambulation helped relieve pain. No noted distress. Will continue to monitor.

## 2017-08-22 NOTE — Progress Notes (Signed)
Patient ID: Gabriel Norman, male   DOB: 09/18/1971, 46 y.o.   MRN: [id number] BP 119/81 (BP Location: Left Arm)   Pulse (!) 108   Temp 100.2 F (37.9 C) (Oral)   Resp 18   Ht  (1.753 m)   Wt 78.6 kg (173 lb 4.8 oz)   SpO2 97%   BMI 25.59 kg/m  aLert and oriented x 4 Chest xray suggested pneumonia, wbc only 9.8 Will check xray in the morning

## 2017-08-23 ENCOUNTER — Inpatient Hospital Stay (HOSPITAL_COMMUNITY): Payer: Worker's Compensation

## 2017-08-23 LAB — CBC WITH DIFFERENTIAL/PLATELET
Basophils Absolute: 0 10*3/uL (ref 0.0–0.1)
Basophils Relative: 0 %
Eosinophils Absolute: 0.3 10*3/uL (ref 0.0–0.7)
Eosinophils Relative: 4 %
HCT: 36.2 % — ABNORMAL LOW (ref 39.0–52.0)
Hemoglobin: 12 g/dL — ABNORMAL LOW (ref 13.0–17.0)
Lymphocytes Relative: 20 %
Lymphs Abs: 1.6 10*3/uL (ref 0.7–4.0)
MCH: 29.5 pg (ref 26.0–34.0)
MCHC: 33.1 g/dL (ref 30.0–36.0)
MCV: 88.9 fL (ref 78.0–100.0)
Monocytes Absolute: 0.8 10*3/uL (ref 0.1–1.0)
Monocytes Relative: 10 %
Neutro Abs: 5.3 10*3/uL (ref 1.7–7.7)
Neutrophils Relative %: 66 %
Platelets: 196 10*3/uL (ref 150–400)
RBC: 4.07 MIL/uL — ABNORMAL LOW (ref 4.22–5.81)
RDW: 14.7 % (ref 11.5–15.5)
WBC: 8 10*3/uL (ref 4.0–10.5)

## 2017-08-23 MED ORDER — CHLORPROMAZINE HCL 50 MG PO TABS
50.0000 mg | ORAL_TABLET | Freq: Three times a day (TID) | ORAL | Status: DC
  Administered 2017-08-23 – 2017-08-25 (×4): 50 mg via ORAL
  Filled 2017-08-23 (×7): qty 1

## 2017-08-23 NOTE — Progress Notes (Signed)
OT Note - addendum    08/23/17 1647  OT Visit Information  Last OT Received On 08/23/17  OT Time Calculation  OT Start Time (ACUTE ONLY) 1421  OT Stop Time (ACUTE ONLY) 1448  OT Time Calculation (min) 27 min  OT General Charges  $OT Visit 1 Visit  OT Treatments  $Self Care/Home Management  23-37 mins  [facility] Mallory Schaad, OT/L  [phone] 08/23/2017

## 2017-08-23 NOTE — Therapy (Signed)
Occupational Therapy Treatment Patient Details Name: Gabriel Norman MRN: [id number] DOB: Nov 17, 1970 Today's Date: 08/23/2017    History of present illness Pt is a 46 y/o male s/p PLIF L5-S1. PMH cervical surgery.   OT comments  Focus of today's treatment session on increased independence with ADLs and functional mobility. Pt educated on use of AE for LB ADLs and completed grooming task in standing with supervision. Pt able to don back brace with min assist. OT will continue to follow acutely to address established goals.    Follow Up Recommendations  Home health OT;Supervision - Intermittent    Equipment Recommendations  3 in 1 bedside commode    Recommendations for Other Services      Precautions / Restrictions Precautions Precautions: Back;Fall Precaution Comments: Pt able to recall 3/3 precautions Required Braces or Orthoses: Spinal Brace Spinal Brace: Lumbar corset;Applied in sitting position Restrictions Weight Bearing Restrictions: No       Mobility Bed Mobility Overal bed mobility: Needs Assistance Bed Mobility: Rolling;Sidelying to Sit Rolling: Supervision Sidelying to sit: Supervision       General bed mobility comments: HOB flat to simulate home set up, supervision for safety  Transfers Overall transfer level: Needs assistance Equipment used: Rolling walker (2 wheeled) Transfers: Sit to/from Stand Sit to Stand: Min assist         General transfer comment: min assist to power up into standing, increased time and effort due to pain     Balance Overall balance assessment: Needs assistance Sitting-balance support: Feet supported Sitting balance-Leahy Scale: Fair     Standing balance support: Bilateral upper extremity supported Standing balance-Leahy Scale: Fair Standing balance comment: Pt able to complete grooming task at sink with single UE support.                            ADL either performed or assessed with clinical judgement    ADL Overall ADL's : Needs assistance/impaired     Grooming: Brushing hair;Set up;Supervision/safety;Standing Grooming Details (indicate cue type and reason): Supervision for safety while standing to brush hair          Upper Body Dressing : Minimal assistance;Sitting Upper Body Dressing Details (indicate cue type and reason): Pt able to don brace with min assist. Pt able to verbalize wear schedule for brace                  Functional mobility during ADLs: Min guard;Rolling walker. Initially, pt demonstrated a stand step gait pattern, with verbal cues pt's gait patterned and speed improved  General ADL Comments: Reviewed AE for LB ADLs and peri care. Pt unable to bring feet over knees to complete LB ADLs. Pt and wife verbalized understanding of AE and wife is agreeable to provide assistance as needed upon d/c. Wife reports plans to purchase hip kit and toilet aide for pt.      Vision       Perception     Praxis      Cognition Arousal/Alertness: Awake/alert Behavior During Therapy: WFL for tasks assessed/performed Overall Cognitive Status: Within Functional Limits for tasks assessed                                          Exercises     Shoulder Instructions       General Comments Pts wife present during therapy and  participated in all education. Pt reports no concerns about d/c home.     Pertinent Vitals/ Pain       Pain Assessment: 0-10 Pain Score: 7  Pain Location: Back Pain Descriptors / Indicators: Grimacing;Guarding Pain Intervention(s): Monitored during session  Home Living                                          Prior Functioning/Environment              Frequency  Min 3X/week        Progress Toward Goals  OT Goals(current goals can now be found in the care plan section)  Progress towards OT goals: Progressing toward goals  Acute Rehab OT Goals Patient Stated Goal: To go home OT Goal  Formulation: With patient Time For Goal Achievement: 09/02/17 Potential to Achieve Goals: Good ADL Goals Pt Will Perform Upper Body Bathing: with supervision;with adaptive equipment;with caregiver independent in assisting;sitting Pt Will Perform Lower Body Bathing: with supervision;with caregiver independent in assisting;with adaptive equipment;sitting/lateral leans Pt Will Perform Upper Body Dressing: with supervision;with caregiver independent in assisting;sitting Pt Will Perform Lower Body Dressing: with min assist;with caregiver independent in assisting;with adaptive equipment;sit to/from stand Pt Will Transfer to Toilet: with supervision;stand pivot transfer;bedside commode Pt Will Perform Toileting - Clothing Manipulation and hygiene: with supervision;sit to/from stand;with adaptive equipment Additional ADL Goal #1: Pt will recall 3/3 back precautions and maintain during ADL activity with less than 2 verbal cues Additional ADL Goal #2: Pt will perform bed mobility at supervision level prior to initiating ADL activity  Plan Discharge plan remains appropriate    Co-evaluation                 AM-PAC PT "6 Clicks" Daily Activity     Outcome Measure   Help from another person eating meals?: None Help from another person taking care of personal grooming?: None Help from another person toileting, which includes using toliet, bedpan, or urinal?: A Little Help from another person bathing (including washing, rinsing, drying)?: A Little Help from another person to put on and taking off regular upper body clothing?: A Little Help from another person to put on and taking off regular lower body clothing?: A Little 6 Click Score: 20    End of Session Equipment Utilized During Treatment: Gait belt;Rolling walker;Back brace  OT Visit Diagnosis: Unsteadiness on feet (R26.81);Other abnormalities of gait and mobility (R26.89);Pain Pain - part of body:  (Back)   Activity Tolerance Patient  tolerated treatment well   Patient Left in chair;with call bell/phone within reach;with family/visitor present   Nurse Communication Mobility status        Time: 0814-4818 OT Time Calculation (min): 27 min  Charges:    Boykin Peek, OTS [phone]    Boykin Peek 08/23/2017, 4:41 PM

## 2017-08-23 NOTE — Progress Notes (Signed)
Patient ID: Gabriel Norman, male   DOB: [birth year], 46 y.o.   MRN: [id number] BP 95/60 (BP Location: Left Arm)   Pulse (!) 113   Temp (!) 100.9 F (38.3 C) (Oral)   Resp 18   Ht 5\' 9"  (1.753 m)   Wt 78.6 kg (173 lb 4.8 oz)   SpO2 99%   BMI 25.59 kg/m  Alert and oriented x 4 speech is clear and fluent Moving all extremities Dressing, clean, dry, no signs of infection Still with a low grade temp

## 2017-08-23 NOTE — Progress Notes (Signed)
Physical Therapy Treatment Patient Details Name: Gabriel SensorBobby Hilligoss MRN: [id number] DOB: 09-May-1971 Today's Date: 08/23/2017    History of Present Illness Pt is a 46 y/o male s/p PLIF L5-S1. PMH cervical surgery.    PT Comments    Pt expressed frustration with night staff and reporting that he did not have a good night, therefore he was very tired today and limited with endurance. Pt also reporting 10/10 pain throughout. Pt remains very slow and cautious with a guarded gait pattern but no physical assistance required. Pt would continue to benefit from skilled physical therapy services at this time while admitted and after d/c to address the below listed limitations in order to improve overall safety and independence with functional mobility.    Follow Up Recommendations  Home health PT;Supervision for mobility/OOB     Equipment Recommendations  Rolling walker with 5" wheels;3in1 (PT)    Recommendations for Other Services       Precautions / Restrictions Precautions Precautions: Back;Fall Required Braces or Orthoses: Spinal Brace Spinal Brace: Lumbar corset;Applied in sitting position Restrictions Weight Bearing Restrictions: No    Mobility  Bed Mobility Overal bed mobility: Needs Assistance Bed Mobility: Rolling;Sidelying to Sit;Sit to Sidelying Rolling: Supervision Sidelying to sit: Supervision     Sit to sidelying: Supervision General bed mobility comments: HOB flat to simulate home set up, supervision for safety  Transfers Overall transfer level: Needs assistance Equipment used: Rolling walker (2 wheeled) Transfers: Sit to/from Stand Sit to Stand: Min assist         General transfer comment: min assist to power up into standing secondary to pain.   Ambulation/Gait Ambulation/Gait assistance: Supervision Ambulation Distance (Feet): 100 Feet Assistive device: Rolling walker (2 wheeled) Gait Pattern/deviations: Step-through pattern;Step-to pattern Gait velocity:  decreased Gait velocity interpretation: Below normal speed for age/gender General Gait Details: distance limited secondary to pain and pt very tired in general, reporting that he did not have a good night. pt very slow and cautious with a guarded gait pattern. No instability or LOB, supervision for safety   Stairs            Wheelchair Mobility    Modified Rankin (Stroke Patients Only)       Balance Overall balance assessment: Needs assistance Sitting-balance support: Feet supported Sitting balance-Leahy Scale: Fair     Standing balance support: Bilateral upper extremity supported Standing balance-Leahy Scale: Poor Standing balance comment: heavy reliance on bil UE support on RW                            Cognition Arousal/Alertness: Awake/alert Behavior During Therapy: WFL for tasks assessed/performed Overall Cognitive Status: Within Functional Limits for tasks assessed                                        Exercises      General Comments        Pertinent Vitals/Pain Pain Assessment: 0-10 Pain Score: 10-Worst pain ever Pain Location: back and bilateral LEs Pain Descriptors / Indicators: Grimacing;Guarding Pain Intervention(s): Monitored during session;Repositioned    Home Living                      Prior Function            PT Goals (current goals can now be found in the care plan section) Acute  Rehab PT Goals PT Goal Formulation: With patient/family Time For Goal Achievement: 09/02/17 Potential to Achieve Goals: Good Progress towards PT goals: Progressing toward goals    Frequency    Min 5X/week      PT Plan Current plan remains appropriate    Co-evaluation              AM-PAC PT "6 Clicks" Daily Activity  Outcome Measure  Difficulty turning over in bed (including adjusting bedclothes, sheets and blankets)?: A Little Difficulty moving from lying on back to sitting on the side of the bed? : A  Little Difficulty sitting down on and standing up from a chair with arms (e.g., wheelchair, bedside commode, etc,.)?: Unable Help needed moving to and from a bed to chair (including a wheelchair)?: None Help needed walking in hospital room?: A Little Help needed climbing 3-5 steps with a railing? : A Little 6 Click Score: 17    End of Session Equipment Utilized During Treatment: Gait belt;Back brace Activity Tolerance: Patient limited by pain Patient left: in bed;with call bell/phone within reach Nurse Communication: Mobility status PT Visit Diagnosis: Other abnormalities of gait and mobility (R26.89);Pain Pain - part of body:  (back)     Time: 1610-9604 PT Time Calculation (min) (ACUTE ONLY): 64 min  Charges:  $Gait Training: 23-37 mins $Therapeutic Activity: 23-37 mins                    G Codes:       [place], [place], [place] [phone]    Alessandra Bevels Lorae Roig 08/23/2017, 12:25 PM

## 2017-08-24 NOTE — Progress Notes (Signed)
CM faxed HH orders to patients [employer] on Tuesday. CM has attempted to confirm with Stevie the CM the receipt of the orders and [facility] Wednesday and multiple times today. CM has not been able to reach [place]. CM called the company and found that Stevie no longer works for the [employer]. CM has left voice mail with one of the supervisors for a return call Marja Kays(Beverly Jeter: [phone] ext: [phone]). CM following.

## 2017-08-24 NOTE — Progress Notes (Signed)
Physical Therapy Treatment Patient Details Name: Gabriel SensorBobby Brubeck MRN: [id number] DOB: December 30, 1970 Today's Date: 08/24/2017    History of Present Illness Pt is a 46 y/o male s/p PLIF L5-S1. PMH cervical surgery.    PT Comments    Pt continues to make steady progress with mobility. Pt would continue to benefit from skilled physical therapy services at this time while admitted and after d/c to address the below listed limitations in order to improve overall safety and independence with functional mobility.    Follow Up Recommendations  Home health PT;Supervision for mobility/OOB     Equipment Recommendations  Rolling walker with 5" wheels;3in1 (PT)    Recommendations for Other Services       Precautions / Restrictions Precautions Precautions: Back;Fall Required Braces or Orthoses: Spinal Brace Spinal Brace: Lumbar corset;Applied in sitting position Restrictions Weight Bearing Restrictions: No    Mobility  Bed Mobility Overal bed mobility: Needs Assistance Bed Mobility: Rolling;Sidelying to Sit;Sit to Sidelying Rolling: Supervision Sidelying to sit: Supervision     Sit to sidelying: Supervision General bed mobility comments: HOB flat to simulate home set up, supervision for safety  Transfers Overall transfer level: Needs assistance Equipment used: Rolling walker (2 wheeled) Transfers: Sit to/from Stand Sit to Stand: Min guard         General transfer comment: increased time, good hand placement, min guard for safety  Ambulation/Gait Ambulation/Gait assistance: Supervision Ambulation Distance (Feet): 150 Feet Assistive device: Rolling walker (2 wheeled) Gait Pattern/deviations: Step-through pattern Gait velocity: decreased Gait velocity interpretation: Below normal speed for age/gender General Gait Details: pt continues to be limited secondary to pain and fatigue; however, with greatly improved gait this session and slightly improved gait speed   Stairs             Wheelchair Mobility    Modified Rankin (Stroke Patients Only)       Balance Overall balance assessment: Needs assistance Sitting-balance support: Feet supported Sitting balance-Leahy Scale: Fair     Standing balance support: During functional activity Standing balance-Leahy Scale: Fair                              Cognition Arousal/Alertness: Awake/alert Behavior During Therapy: WFL for tasks assessed/performed Overall Cognitive Status: Within Functional Limits for tasks assessed                                        Exercises      General Comments        Pertinent Vitals/Pain Pain Assessment: Faces Faces Pain Scale: Hurts little more Pain Location: Back Pain Descriptors / Indicators: Grimacing;Guarding Pain Intervention(s): Monitored during session;Repositioned    Home Living                      Prior Function            PT Goals (current goals can now be found in the care plan section) Acute Rehab PT Goals PT Goal Formulation: With patient/family Time For Goal Achievement: 09/02/17 Potential to Achieve Goals: Good Progress towards PT goals: Progressing toward goals    Frequency    Min 5X/week      PT Plan Current plan remains appropriate    Co-evaluation              AM-PAC PT "6 Clicks" Daily Activity  Outcome  Measure  Difficulty turning over in bed (including adjusting bedclothes, sheets and blankets)?: A Little Difficulty moving from lying on back to sitting on the side of the bed? : A Little Difficulty sitting down on and standing up from a chair with arms (e.g., wheelchair, bedside commode, etc,.)?: Unable Help needed moving to and from a bed to chair (including a wheelchair)?: None Help needed walking in hospital room?: None Help needed climbing 3-5 steps with a railing? : A Little 6 Click Score: 18    End of Session Equipment Utilized During Treatment: Gait belt;Back  brace Activity Tolerance: Patient limited by pain Patient left: in bed;with call bell/phone within reach Nurse Communication: Mobility status PT Visit Diagnosis: Other abnormalities of gait and mobility (R26.89);Pain Pain - part of body:  (back)     Time: 1610-9604 PT Time Calculation (min) (ACUTE ONLY): 25 min  Charges:  $Gait Training: 8-22 mins $Therapeutic Activity: 8-22 mins                    G Codes:       [place], [place], [place] [phone]    Alessandra Bevels Kip Kautzman 08/24/2017, 4:28 PM

## 2017-08-24 NOTE — Progress Notes (Signed)
Patient ID: Gabriel Norman, male   DOB: 03/12/1971, [age] y.o.   MRN: [id number] BP 105/64 (BP Location: Left Arm)   Pulse (!) 106   Temp 99.8 F (37.7 C) (Oral)   Resp 18   Ht 5\' 9"  (1.753 m)   Wt 78.6 kg (173 lb 4.8 oz)   SpO2 98%   BMI 25.59 kg/m  Alert and oriented x 4, speech is clear and fluent Moving all extremities well Wbc is lower , still with low grade fever Wound is clean, dry, no signs of infection

## 2017-08-25 MED ORDER — OXYCODONE HCL 5 MG PO TABS: 5.0000 mg | ORAL_TABLET | Freq: Four times a day (QID) | ORAL | 0 refills | Status: DC | PRN

## 2017-08-25 MED ORDER — OXYCODONE HCL 5 MG PO TABS
5.0000 mg | ORAL_TABLET | Freq: Three times a day (TID) | ORAL | 0 refills | Status: DC | PRN
Start: 2017-08-25 — End: 2019-10-14

## 2017-08-25 MED ORDER — OXYCODONE HCL ER 15 MG PO T12A: 15.0000 mg | EXTENDED_RELEASE_TABLET | Freq: Two times a day (BID) | ORAL | 0 refills | Status: DC

## 2017-08-25 NOTE — Discharge Instructions (Signed)

## 2017-08-25 NOTE — Progress Notes (Signed)
Patient discharged home. Discharge instructions were reviewed with the patient and wife. Patient and wife verbalized understanding.  

## 2017-08-25 NOTE — Progress Notes (Signed)
PT Cancellation Note  Patient Details Name: Gabriel SensorBobby Mcdonell MRN: [id number] DOB: 03-Jan-1971   Cancelled Treatment:    Reason Eval/Treat Not Completed: Patient declined, no reason specified. Pt reported that he is d/c'ing home today and is "too tired" to work with therapist at this time. Pt's spouse present and reporting that pt ambulated entire unit hallway with her this AM. Pt expressed gratitude towards therapist and very appreciative.    Alessandra BevelsJennifer M Obera Stauch 08/25/2017, 12:15 PM

## 2017-08-25 NOTE — Care Management Note (Addendum)
Case Management Note  Patient Details  Name: Gabriel SensorBobby Shawler MRN: [id number] Date of Birth: 1971/03/21  Subjective/Objective:                    Action/Plan: Pt discharging home today. New CM assigned to patients Workers Comp Alvis Lemmings(Dawn CorningBurton: [phone]). CM provided the information to Mr [name]. The Worker Comp CM [facility] the [facility] and is working on arranging this for him at home.  Wife to provide transportation home today.  Expected Discharge Date:  08/25/17               Expected Discharge Plan:  Home w Home Health Services  In-House Referral:     Discharge planning Services  CM Consult  Post Acute Care Choice:  Durable Medical Equipment, Home Health Choice offered to:  Patient, Spouse  DME Arranged:  3-N-1, Walker rolling DME Agency:  [facility]  HH Arranged:  PT (set up through his workers comp) HH Agency:     Status of Service:  Completed, signed off  If discussed at [employer] of [employer] Meetings, dates discussed:    Additional Comments:  Kermit BaloKelli F Shaelyn Decarli, RN 08/25/2017, 12:37 PM

## 2017-08-25 NOTE — Discharge Summary (Signed)
Physician Discharge Summary  Patient ID: Gabriel SensorBobby Leathers MRN: [id number] DOB/AGE: [birth year] 46 y.o.  Admit date: 08/18/2017 Discharge date: 08/25/2017  Admission Diagnoses:Osteoarthritis lumbar spine with radiculopathy  Discharge Diagnoses:  Active Problems:   Osteoarthritis of spine with radiculopathy, lumbar region   Discharged Condition: good  Hospital Course: Mr. Gabriel Norman was taken to the operating room for an uncomplicated lumbar fusion at L5/S1. Post op he developed a low grade fever. His lungs were slightly suggestive of pneumonia, but I repeated the scan and there was no progression. Blood cx were negative, urine was clean, and white count dropped during his hospitalization. His wound has remained clean,dry, and without signs of infection. He is voiding, tolerating a regular diet, and ambulating. He will be discharged home.   Treatments: surgery: POSTERIOR LUMBAR INTERBODY FUSION LUMBAR 5- SACRAL 1 , auto graft morsels, synthes expandable plif cages x2 Posterolateral arthrodesis Non segmental pedicle screw fixation , nuvasive relign hardware      Discharge Exam: Blood pressure 99/71, pulse 98, temperature 99 F (37.2 C), temperature source Oral, resp. rate 18, height 5\' 9"  (1.753 m), weight 78.6 kg (173 lb 4.8 oz), SpO2 95 %. General appearance: alert, cooperative, appears stated age and no distress Neurologic: Alert and oriented X 3, normal strength and tone. Normal symmetric reflexes. Normal coordination and Gabriel  Disposition: 01-Home or Self Care LUMBAGO WITH SCIATICA, RIGHT SIDE Discharge Instructions    Face-to-face encounter (required for Medicare/Medicaid patients)    Complete by:  As directed    I Denise Bramblett L certify that this patient is under my care and that I, or a nurse practitioner or physician's assistant working with me, had a face-to-face encounter that meets the physician face-to-face encounter requirements with this patient on 08/25/2017. The encounter  with the patient was in whole, or in part for the following medical condition(s) which is the primary reason for home health care (List medical condition): lumbar osteoarthritis   The encounter with the patient was in whole, or in part, for the following medical condition, which is the primary reason for home health care:  lumbar osteoarthritis   I certify that, based on my findings, the following services are medically necessary home health services:  Physical therapy   Reason for Medically Necessary Home Health Services:  Therapy- Investment banker, operationalGait Norman, Teacher, early years/preTransfer Norman and Stair Norman   My clinical findings support the need for the above services:  Pain interferes with ambulation/mobility   Further, I certify that my clinical findings support that this patient is homebound due to:  Pain interferes with ambulation/mobility   Home Health    Complete by:  As directed    To provide the following care/treatments:  PT     Allergies as of 08/25/2017   No Known Allergies     Medication List    TAKE these medications   oxyCODONE 5 MG immediate release tablet Commonly known as:  Oxy IR/ROXICODONE Take 1 tablet (5 mg total) by mouth 3 (three) times daily as needed for severe pain. What changed:  Another medication with the same name was added. Make sure you understand how and when to take each.   oxyCODONE 5 MG immediate release tablet Commonly known as:  Oxy IR/ROXICODONE Take 1-2 tablets (5-10 mg total) by mouth every 6 (six) hours as needed for breakthrough pain. What changed:  You were already taking a medication with the same name, and this prescription was added. Make sure you understand how and when to take each.   oxyCODONE 15  mg 12 hr tablet Commonly known as:  OXYCONTIN Take 1 tablet (15 mg total) by mouth every 12 (twelve) hours. What changed:  You were already taking a medication with the same name, and this prescription was added. Make sure you understand how and when to take each.    tiZANidine 4 MG capsule Commonly known as:  ZANAFLEX Take 4 mg by mouth at bedtime.            Durable Medical Equipment        Start     Ordered   08/21/17 [phone]  For home use only DME 3 n 1  Once     08/21/17 0924   08/21/17 0924  For home use only DME Walker rolling  Once    Question:  Patient needs a walker to treat with the following condition  Answer:  Status post lumbar surgery   08/21/17 6962     Follow-up Information    Coletta Memos, MD Follow up in 3 week(s).   Specialty:  Neurosurgery Why:  please call the office to make an appointment Contact information: [address] [place] [address] [phone]           Signed: Carmela Hurt 08/25/2017, 11:21 AM

## 2017-08-26 LAB — CULTURE, BLOOD (ROUTINE X 2)
Culture: NO GROWTH
Culture: NO GROWTH
Special Requests: ADEQUATE
Special Requests: ADEQUATE

## 2017-08-31 ENCOUNTER — Inpatient Hospital Stay: Admit: 2017-08-31 | Payer: 59 | Admitting: Neurosurgery

## 2017-08-31 SURGERY — POSTERIOR LUMBAR FUSION 1 LEVEL
Anesthesia: General

## 2017-09-27 ENCOUNTER — Emergency Department (HOSPITAL_COMMUNITY)
Admission: EM | Admit: 2017-09-27 | Discharge: 2017-09-27 | Disposition: A | Discharge status: Home / Self Care | Payer: Worker's Compensation | Attending: Emergency Medicine | Admitting: Emergency Medicine

## 2017-09-27 ENCOUNTER — Emergency Department (HOSPITAL_BASED_OUTPATIENT_CLINIC_OR_DEPARTMENT_OTHER)
Admit: 2017-09-27 | Discharge: 2017-09-27 | Disposition: A | Discharge status: Home / Self Care | Payer: Worker's Compensation

## 2017-09-27 ENCOUNTER — Encounter (HOSPITAL_COMMUNITY): Payer: Self-pay | Admitting: Neurology

## 2017-09-27 DIAGNOSIS — Z79899 Other long term (current) drug therapy: Secondary | ICD-10-CM | POA: Diagnosis not present

## 2017-09-27 DIAGNOSIS — M79609 Pain in unspecified limb: Secondary | ICD-10-CM

## 2017-09-27 DIAGNOSIS — Z87891 Personal history of nicotine dependence: Secondary | ICD-10-CM | POA: Insufficient documentation

## 2017-09-27 DIAGNOSIS — M79604 Pain in right leg: Secondary | ICD-10-CM | POA: Diagnosis present

## 2017-09-27 DIAGNOSIS — M5410 Radiculopathy, site unspecified: Secondary | ICD-10-CM

## 2017-09-27 MED ORDER — DIAZEPAM 5 MG PO TABS
5.0000 mg | ORAL_TABLET | Freq: Once | ORAL | Status: AC
  Administered 2017-09-27: 5 mg via ORAL
  Filled 2017-09-27: qty 1

## 2017-09-27 MED ORDER — GABAPENTIN 300 MG PO CAPS: 300.0000 mg | ORAL_CAPSULE | Freq: Three times a day (TID) | ORAL | 0 refills | Status: DC

## 2017-09-27 MED ORDER — GABAPENTIN 300 MG PO CAPS
300.0000 mg | ORAL_CAPSULE | Freq: Once | ORAL | Status: AC
  Administered 2017-09-27: 300 mg via ORAL
  Filled 2017-09-27: qty 1

## 2017-09-27 MED ORDER — KETOROLAC TROMETHAMINE 30 MG/ML IJ SOLN
30.0000 mg | Freq: Once | INTRAMUSCULAR | Status: DC
  Filled 2017-09-27: qty 1

## 2017-09-27 MED ORDER — MORPHINE SULFATE (PF) 4 MG/ML IV SOLN
4.0000 mg | Freq: Once | INTRAVENOUS | Status: DC
  Filled 2017-09-27: qty 1

## 2017-09-27 MED ORDER — METHYLPREDNISOLONE 4 MG PO TBPK: ORAL_TABLET | ORAL | 0 refills | Status: DC

## 2017-09-27 NOTE — ED Triage Notes (Signed)
Pt reports 10/12 had L5-S1 fusion. Last week develop severe pain to right leg, swelling to right knee, calf. Can't bear weight bear. Prior to last week he was walking, recovering well. Sensation intact, but very painful.

## 2017-09-27 NOTE — ED Provider Notes (Signed)
[facility] EMERGENCY DEPARTMENT Provider Note   CSN: [id number] Arrival date & time: 09/27/17  1432     History   Chief Complaint Chief Complaint  Patient presents with  . Leg Pain  . Leg Swelling    HPI Gabriel Norman is a 46 y.o. male with recent history of spinal fusion to L5-S1 on 08/18/2017 who presents with severe right leg pain that began 1 week ago.  Patient reports he was recovering well and having very little back pain and leg pain that began having severe right leg pain the day after physical therapy treatment last week.  He reports he did not do anything out of the ordinary.  No known injury.  His entire leg hurts even to minimal touch.  He denies any, chest pain, shortness of breath, abdominal pain, nausea, vomiting, urinary symptoms.  Patient reports he has been taking oxycodone and muscle relaxers without any relief.  Patient reports prior to onset of pain he was walking around driving recovering very well.  HPI  Past Medical History:  Diagnosis Date  . Sciatica   . Seasonal allergies     Patient Active Problem List   Diagnosis Date Noted  . Osteoarthritis of spine with radiculopathy, lumbar region 08/18/2017    Past Surgical History:  Procedure Laterality Date  . BACK SURGERY    . NECK SURGERY         Home Medications    Prior to Admission medications   Medication Sig Start Date End Date Taking? Authorizing Provider  oxyCODONE (OXY IR/ROXICODONE) 5 MG immediate release tablet Take 1-2 tablets (5-10 mg total) by mouth every 6 (six) hours as needed for breakthrough pain. 08/25/17  Yes Coletta Memos, MD  tiZANidine (ZANAFLEX) 4 MG capsule Take 4 mg by mouth at bedtime.   Yes [provider]  gabapentin (NEURONTIN) 300 MG capsule Take 1 capsule (300 mg total) by mouth 3 (three) times daily. 09/27/17   Jabarie Pop, Waylan Boga, PA-C  methylPREDNISolone (MEDROL DOSEPAK) 4 MG TBPK tablet Take as prescribed on package. 09/27/17   Lavinia Mcneely,  Waylan Boga, PA-C  oxyCODONE (OXY IR/ROXICODONE) 5 MG immediate release tablet Take 1 tablet (5 mg total) by mouth 3 (three) times daily as needed for severe pain. Patient not taking: Reported on 09/27/2017 08/25/17   Coletta Memos, MD  oxyCODONE (OXYCONTIN) 15 mg 12 hr tablet Take 1 tablet (15 mg total) by mouth every 12 (twelve) hours. Patient not taking: Reported on 09/27/2017 08/25/17   Coletta Memos, MD    Family History No family history on file.  Social History Social History   Tobacco Use  . Smoking status: Former Smoker    Packs/day: 0.50  . Smokeless tobacco: Never Used  Substance Use Topics  . Alcohol use: Yes    Alcohol/week: 1.2 oz    Types: 2 Standard drinks or equivalent per week    Comment: occasional  . Drug use: No     Allergies   Patient has no known allergies.   Review of Systems Review of Systems  Constitutional: Negative for chills and fever.  HENT: Negative for facial swelling and sore throat.   Respiratory: Negative for shortness of breath.   Cardiovascular: Negative for chest pain.  Gastrointestinal: Negative for abdominal pain, nausea and vomiting.  Genitourinary: Negative for dysuria.  Musculoskeletal: Positive for arthralgias, back pain (R sided) and myalgias. Negative for neck pain.  Skin: Negative for rash and wound.  Neurological: Negative for headaches.  Psychiatric/Behavioral: The patient is  not nervous/anxious.      Physical Exam Updated Vital Signs BP 116/83   Pulse 73   Temp 98.5 F (36.9 C) (Oral)   Resp 16   Ht 5\' 9"  (1.753 m)   Wt 74.4 kg (164 lb)   SpO2 96%   BMI 24.22 kg/m   Physical Exam  Constitutional: He appears well-developed and well-nourished. No distress.  HENT:  Head: Normocephalic and atraumatic.  Mouth/Throat: Oropharynx is clear and moist. No oropharyngeal exudate.  Eyes: Conjunctivae are normal. Pupils are equal, round, and reactive to light. Right eye exhibits no discharge. Left eye exhibits no  discharge. No scleral icterus.  Neck: Normal range of motion. Neck supple. No thyromegaly present.  Cardiovascular: Normal rate, regular rhythm, normal heart sounds and intact distal pulses. Exam reveals no gallop and no friction rub.  No murmur heard. Pulmonary/Chest: Effort normal and breath sounds normal. No stridor. No respiratory distress. He has no wheezes. He has no rales.  Abdominal: Soft. Bowel sounds are normal. He exhibits no distension. There is no tenderness. There is no rebound and no guarding.  Musculoskeletal: He exhibits no edema.       Legs: No midline cervical, thoracic, or lumbar tenderness Pain to even light touch of entire R leg and R lumbar Sensation intact bilaterally 5/5 strength in the left lower extremity, 4/5 on the right Patellar reflexes 2+ on the left, however patient would not allow me to test on the right due to pain Mild warmth to R leg Minimal edema to R leg, no color change Well healing scar  Lymphadenopathy:    He has no cervical adenopathy.  Neurological: He is alert. Coordination normal.  Skin: Skin is warm and dry. No rash noted. He is not diaphoretic. No pallor.  Psychiatric: He has a normal mood and affect.  Nursing note and vitals reviewed.    ED Treatments / Results  Labs (all labs ordered are listed, but only abnormal results are displayed) Labs Reviewed - No data to display  EKG  EKG Interpretation None       Radiology No results found.  Procedures Procedures (including critical care time)  Medications Ordered in ED Medications  morphine 4 MG/ML injection 4 mg (4 mg Intravenous Not Given 09/27/17 1839)  ketorolac (TORADOL) 30 MG/ML injection 30 mg (30 mg Intravenous Not Given 09/27/17 1840)  gabapentin (NEURONTIN) tablet 300 mg (not administered)  diazepam (VALIUM) tablet 5 mg (5 mg Oral Given 09/27/17 1804)     Initial Impression / Assessment and Plan / ED Course  I have reviewed the triage vital signs and the nursing  notes.  Pertinent labs & imaging results that were available during my care of the patient were reviewed by me and considered in my medical decision making (see chart for details).     Patient with most likely nerve irritation related to recent back surgery.  Patient is afebrile.  Wound is well-appearing.  No signs of infection.  No bowel or bladder incontinence, saddle anesthesia.  Left leg reflex is intact, however patient would not allow me to check on the right. I spoke with neurosurgeon, Dr. Lovell SheehanJenkins, who felt that patient's symptoms most likely related to nerve irritation and epidural abscess or hematoma was much less likely.  He recommended discharge home with Neurontin and Medrol Dosepak with follow-up to patient's neurosurgeon, Dr. Franky Machoabbell, on Monday for further evaluation and treatment.  Patient given strict return precautions.  Patient offered IV or IM pain medications in the ED, however  he declined. Patient given Valium in the ED instead considering oxycodone has not been helping at home.  Will discharge home with Neurontin and Medrol Dosepak.  Patient understands and agrees with plan.  Patient vitals stable throughout ED course and discharged in satisfactory condition. I also discussed patient case with Dr. Adela LankFloyd who guided the patient's management and agrees with plan.   Final Clinical Impressions(s) / ED Diagnoses   Final diagnoses:  Radicular leg pain    ED Discharge Orders        Ordered    gabapentin (NEURONTIN) 300 MG capsule  3 times daily     09/27/17 1834    methylPREDNISolone (MEDROL DOSEPAK) 4 MG TBPK tablet     09/27/17 1834       Emi HolesLaw, Larone Kliethermes M, PA-C 09/27/17 1849    Melene PlanFloyd, Dan, DO 09/27/17 2011

## 2017-09-27 NOTE — Progress Notes (Signed)
*  PRELIMINARY RESULTS* Vascular Ultrasound Right lower extremity venous duplex has been completed.  Preliminary findings: No evidence of deep vein thrombosis or baker's cysts in the right lower extremity. Nothing seen lateral/posterior right knee, area of complaint.   Gabriel FischerCharlotte C Ruble Buttler 09/27/2017, 4:35 PM

## 2017-09-27 NOTE — ED Notes (Signed)
Attempted Iv stick unsuccessful. Patient requests for no more IV sticks and requesting PO pain medication. EDP aware

## 2017-09-27 NOTE — Discharge Instructions (Signed)
Medications: Neurontin, Medrol Dosepak  Treatment: Take Neurontin 3 times daily as prescribed.  Take Medrol Dosepak until completion.  Follow-up: Please follow-up with Dr. Franky Machoabbell as soon as possible for further evaluation and treatment.  Please return to the emergency department immediately if you develop any bowel or bladder incontinence, numbness in your groin, complete numbness of your legs, or any other new or concerning symptoms.

## 2019-03-19 DIAGNOSIS — M5441 Lumbago with sciatica, right side: Secondary | ICD-10-CM | POA: Diagnosis not present

## 2019-05-27 IMAGING — DX DG CHEST 2V
2 series · 2 of 2 positions shown · non-contrast
Comparison: 08/21/2017

CLINICAL DATA: Pneumonia, weakness, post lumbar surgery

EXAM:
CHEST  2 VIEW

[x chest ap]
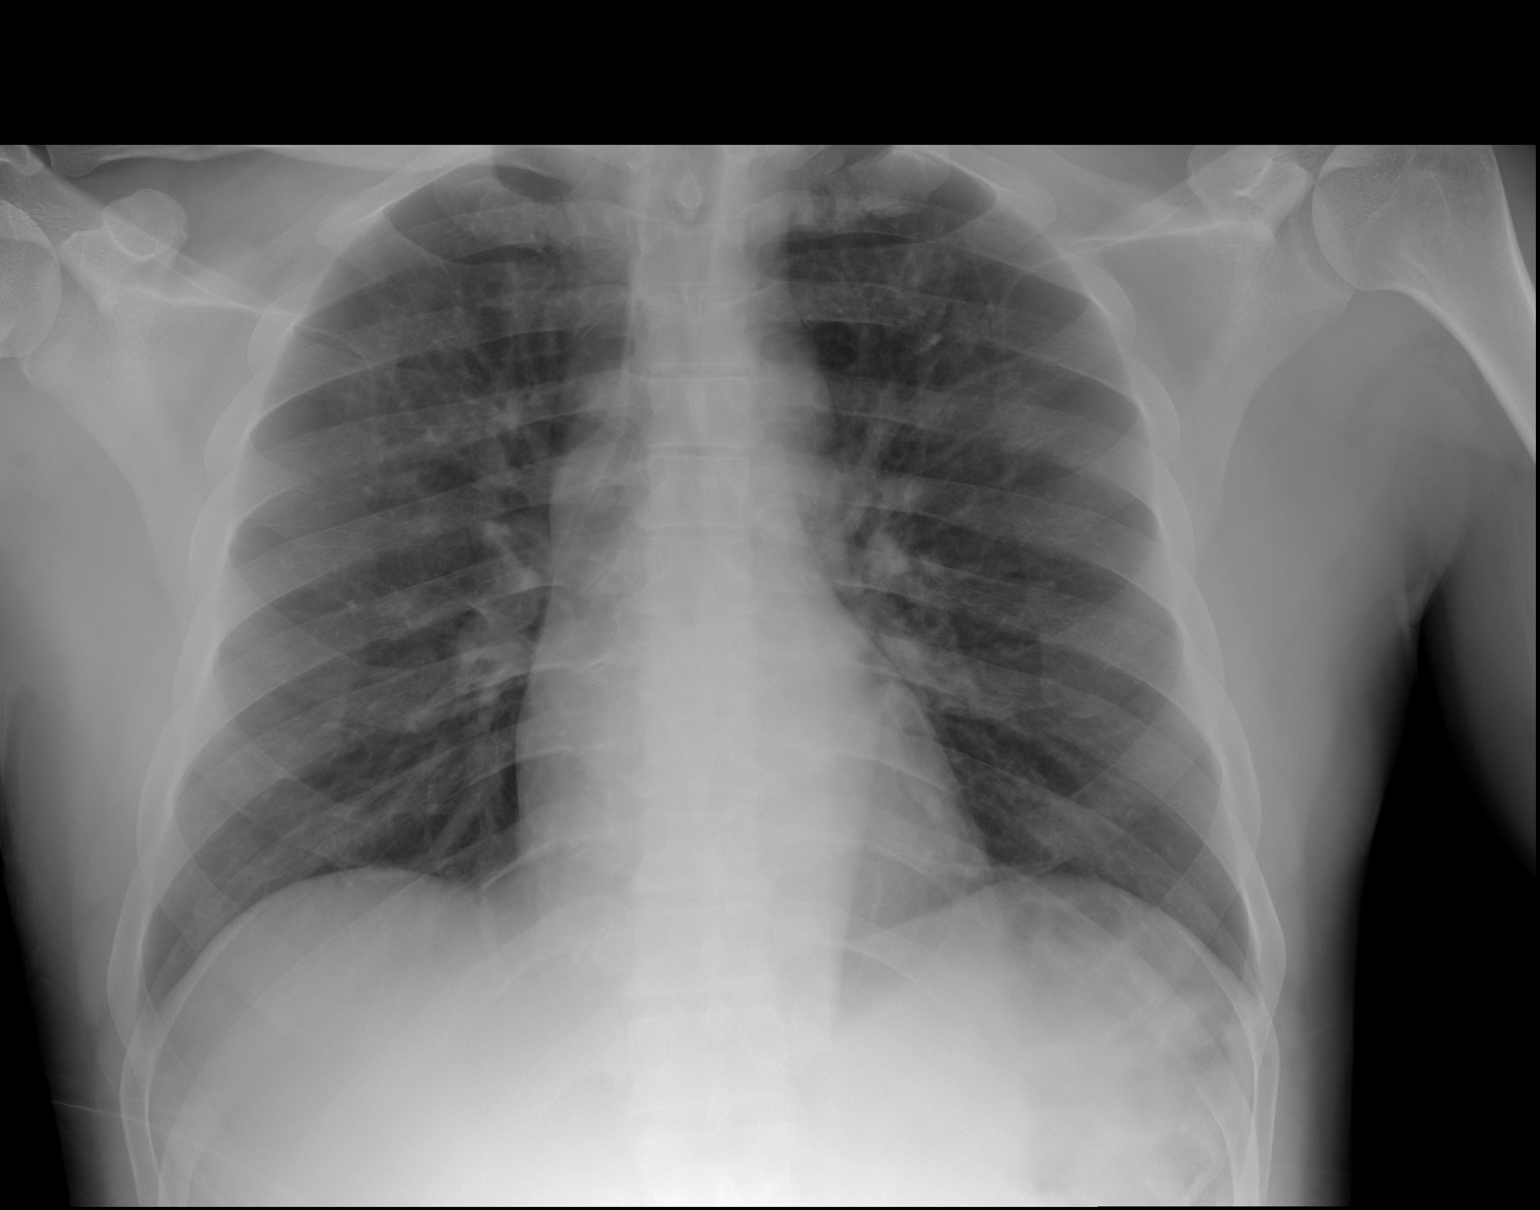

[w chest lat]
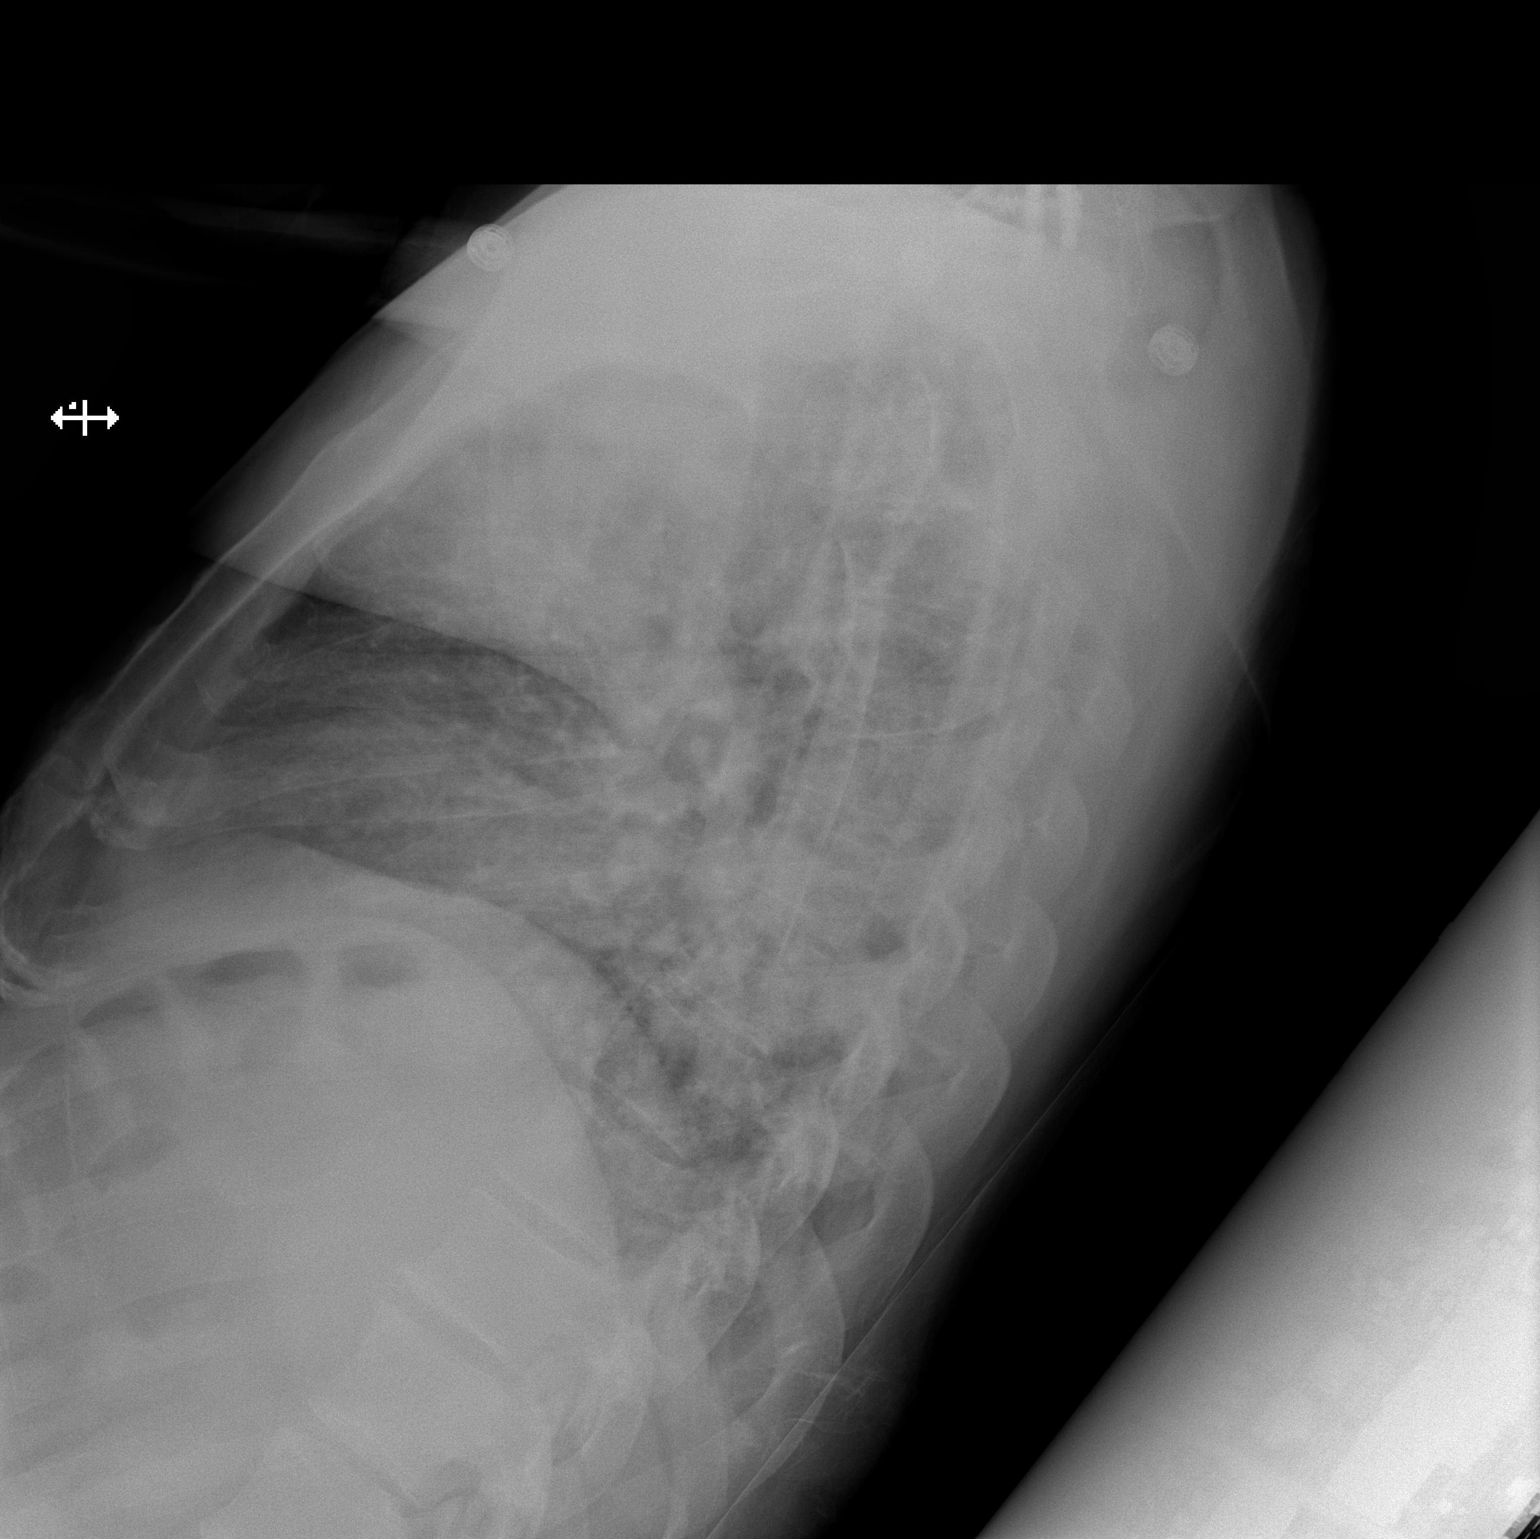

[2 of 2 positions shown; findings below may reference images not displayed]

FINDINGS: Improving aeration in the lungs with increasing lung volumes. Slight
increased densities in the upper lobes bilaterally could reflect
pneumonia. This is similar to prior study. Heart is normal size. No
effusions.
IMPRESSION: Subtle slight increased opacities in the upper lobes bilaterally,
right slightly greater than left, possible pneumonia. No real
change.

## 2019-08-06 DIAGNOSIS — Z6825 Body mass index (BMI) 25.0-25.9, adult: Secondary | ICD-10-CM | POA: Diagnosis not present

## 2019-08-06 DIAGNOSIS — R030 Elevated blood-pressure reading, without diagnosis of hypertension: Secondary | ICD-10-CM | POA: Diagnosis not present

## 2019-08-06 DIAGNOSIS — M542 Cervicalgia: Secondary | ICD-10-CM | POA: Diagnosis not present

## 2019-08-06 DIAGNOSIS — M5441 Lumbago with sciatica, right side: Secondary | ICD-10-CM | POA: Diagnosis not present

## 2019-09-06 DIAGNOSIS — M549 Dorsalgia, unspecified: Secondary | ICD-10-CM | POA: Diagnosis not present

## 2019-09-06 DIAGNOSIS — R202 Paresthesia of skin: Secondary | ICD-10-CM | POA: Diagnosis not present

## 2019-09-17 DIAGNOSIS — Z3009 Encounter for other general counseling and advice on contraception: Secondary | ICD-10-CM | POA: Diagnosis not present

## 2019-09-19 ENCOUNTER — Other Ambulatory Visit: Payer: Self-pay | Admitting: Urology

## 2019-10-14 NOTE — Progress Notes (Signed)
Spoke w/ pt today via phone to make covid appt.  Pt states he is going to cancel procedure and have it done in the office with local. Pt verbalized understanding that he is to call Dr Gloriann Loan OR scheduler, Pam.

## 2019-10-15 ENCOUNTER — Other Ambulatory Visit (HOSPITAL_COMMUNITY): Payer: 59

## 2019-10-18 ENCOUNTER — Ambulatory Visit (HOSPITAL_BASED_OUTPATIENT_CLINIC_OR_DEPARTMENT_OTHER): Admission: RE | Admit: 2019-10-18 | Payer: BC Managed Care – PPO | Source: Home / Self Care | Admitting: Urology

## 2019-10-18 SURGERY — VASECTOMY
Anesthesia: General | Laterality: Bilateral

## 2019-11-21 DIAGNOSIS — Z302 Encounter for sterilization: Secondary | ICD-10-CM | POA: Diagnosis not present

## 2019-12-20 DIAGNOSIS — R202 Paresthesia of skin: Secondary | ICD-10-CM | POA: Diagnosis not present

## 2019-12-20 DIAGNOSIS — M549 Dorsalgia, unspecified: Secondary | ICD-10-CM | POA: Diagnosis not present

## 2020-10-16 DIAGNOSIS — R202 Paresthesia of skin: Secondary | ICD-10-CM | POA: Diagnosis not present

## 2020-10-16 DIAGNOSIS — Z13220 Encounter for screening for lipoid disorders: Secondary | ICD-10-CM | POA: Diagnosis not present

## 2020-10-16 DIAGNOSIS — Z0000 Encounter for general adult medical examination without abnormal findings: Secondary | ICD-10-CM | POA: Diagnosis not present

## 2020-11-14 ENCOUNTER — Ambulatory Visit: Payer: 59 | Attending: Internal Medicine

## 2020-11-14 DIAGNOSIS — Z23 Encounter for immunization: Secondary | ICD-10-CM

## 2020-11-14 NOTE — Progress Notes (Signed)
   Covid-19 Vaccination Clinic  Name:  Gabriel Norman    MRN: [id number] DOB: [birth year]  11/14/2020  Mr. Gwynne was observed post Covid-19 immunization for 15 minutes without incident. He was provided with Vaccine Information Sheet and instruction to access the V-Safe system.   Mr. Urbanek was instructed to call 911 with any severe reactions post vaccine: Marland Kitchen Difficulty breathing  . Swelling of face and throat  . A fast heartbeat  . A bad rash all over body  . Dizziness and weakness   Immunizations Administered    Name Date Dose VIS Date Route   Pfizer COVID-19 Vaccine 11/14/2020 12:41 PM 0.3 mL 08/26/2020 Intramuscular   Manufacturer: [employer], [employer]   Lot: [id number]   NDC: [address]-4

## 2020-12-01 DIAGNOSIS — Z01818 Encounter for other preprocedural examination: Secondary | ICD-10-CM | POA: Diagnosis not present

## 2020-12-01 DIAGNOSIS — Z1211 Encounter for screening for malignant neoplasm of colon: Secondary | ICD-10-CM | POA: Diagnosis not present

## 2020-12-07 DIAGNOSIS — K648 Other hemorrhoids: Secondary | ICD-10-CM | POA: Diagnosis not present

## 2020-12-07 DIAGNOSIS — K621 Rectal polyp: Secondary | ICD-10-CM | POA: Diagnosis not present

## 2020-12-07 DIAGNOSIS — Z1211 Encounter for screening for malignant neoplasm of colon: Secondary | ICD-10-CM | POA: Diagnosis not present

## 2021-10-20 DIAGNOSIS — Z0000 Encounter for general adult medical examination without abnormal findings: Secondary | ICD-10-CM | POA: Diagnosis not present

## 2021-11-16 DIAGNOSIS — M5431 Sciatica, right side: Secondary | ICD-10-CM | POA: Diagnosis not present

## 2021-11-16 DIAGNOSIS — M549 Dorsalgia, unspecified: Secondary | ICD-10-CM | POA: Diagnosis not present

## 2021-12-03 ENCOUNTER — Other Ambulatory Visit: Payer: Self-pay | Admitting: Physician Assistant

## 2021-12-03 DIAGNOSIS — M545 Low back pain, unspecified: Secondary | ICD-10-CM | POA: Diagnosis not present

## 2021-12-03 DIAGNOSIS — M5416 Radiculopathy, lumbar region: Secondary | ICD-10-CM

## 2021-12-03 DIAGNOSIS — Z981 Arthrodesis status: Secondary | ICD-10-CM

## 2021-12-03 DIAGNOSIS — G8929 Other chronic pain: Secondary | ICD-10-CM | POA: Diagnosis not present

## 2022-01-06 ENCOUNTER — Other Ambulatory Visit: Payer: Self-pay | Admitting: Family Medicine

## 2022-01-06 DIAGNOSIS — Z981 Arthrodesis status: Secondary | ICD-10-CM

## 2022-01-06 DIAGNOSIS — M545 Low back pain, unspecified: Secondary | ICD-10-CM

## 2022-01-06 DIAGNOSIS — M5416 Radiculopathy, lumbar region: Secondary | ICD-10-CM

## 2022-01-20 ENCOUNTER — Other Ambulatory Visit: Payer: Self-pay

## 2022-01-20 ENCOUNTER — Ambulatory Visit
Admission: RE | Admit: 2022-01-20 | Discharge: 2022-01-20 | Disposition: A | Discharge status: Home / Self Care | Payer: BC Managed Care – PPO | Source: Ambulatory Visit | Attending: Family Medicine | Admitting: Family Medicine

## 2022-01-20 DIAGNOSIS — M47816 Spondylosis without myelopathy or radiculopathy, lumbar region: Secondary | ICD-10-CM | POA: Diagnosis not present

## 2022-01-20 MED ORDER — GADOBENATE DIMEGLUMINE 529 MG/ML IV SOLN
17.0000 mL | Freq: Once | INTRAVENOUS | Status: AC | PRN
  Administered 2022-01-20: 17 mL via INTRAVENOUS

## 2022-02-01 DIAGNOSIS — M4722 Other spondylosis with radiculopathy, cervical region: Secondary | ICD-10-CM | POA: Diagnosis not present

## 2022-02-01 DIAGNOSIS — M542 Cervicalgia: Secondary | ICD-10-CM | POA: Diagnosis not present

## 2022-02-03 DIAGNOSIS — M5031 Other cervical disc degeneration,  high cervical region: Secondary | ICD-10-CM | POA: Diagnosis not present

## 2022-02-03 DIAGNOSIS — M5021 Other cervical disc displacement,  high cervical region: Secondary | ICD-10-CM | POA: Diagnosis not present

## 2022-02-03 DIAGNOSIS — M4722 Other spondylosis with radiculopathy, cervical region: Secondary | ICD-10-CM | POA: Diagnosis not present

## 2022-02-03 DIAGNOSIS — M2578 Osteophyte, vertebrae: Secondary | ICD-10-CM | POA: Diagnosis not present

## 2022-02-07 DIAGNOSIS — M5021 Other cervical disc displacement,  high cervical region: Secondary | ICD-10-CM | POA: Diagnosis not present

## 2022-02-07 DIAGNOSIS — Z6826 Body mass index (BMI) 26.0-26.9, adult: Secondary | ICD-10-CM | POA: Diagnosis not present

## 2022-02-07 DIAGNOSIS — R030 Elevated blood-pressure reading, without diagnosis of hypertension: Secondary | ICD-10-CM | POA: Diagnosis not present

## 2022-02-18 DIAGNOSIS — M4802 Spinal stenosis, cervical region: Secondary | ICD-10-CM | POA: Diagnosis not present

## 2022-02-18 DIAGNOSIS — M5021 Other cervical disc displacement,  high cervical region: Secondary | ICD-10-CM | POA: Diagnosis not present

## 2022-02-18 DIAGNOSIS — M4721 Other spondylosis with radiculopathy, occipito-atlanto-axial region: Secondary | ICD-10-CM | POA: Diagnosis not present

## 2022-02-18 DIAGNOSIS — M4722 Other spondylosis with radiculopathy, cervical region: Secondary | ICD-10-CM | POA: Diagnosis not present

## 2022-03-14 DIAGNOSIS — M542 Cervicalgia: Secondary | ICD-10-CM | POA: Diagnosis not present

## 2022-07-18 DIAGNOSIS — Z0000 Encounter for general adult medical examination without abnormal findings: Secondary | ICD-10-CM | POA: Diagnosis not present

## 2022-07-18 DIAGNOSIS — M5021 Other cervical disc displacement,  high cervical region: Secondary | ICD-10-CM | POA: Diagnosis not present

## 2022-07-18 DIAGNOSIS — R1319 Other dysphagia: Secondary | ICD-10-CM | POA: Diagnosis not present

## 2022-07-18 DIAGNOSIS — Z125 Encounter for screening for malignant neoplasm of prostate: Secondary | ICD-10-CM | POA: Diagnosis not present

## 2022-07-18 DIAGNOSIS — Z13220 Encounter for screening for lipoid disorders: Secondary | ICD-10-CM | POA: Diagnosis not present

## 2022-07-18 DIAGNOSIS — Z6826 Body mass index (BMI) 26.0-26.9, adult: Secondary | ICD-10-CM | POA: Diagnosis not present

## 2022-07-22 ENCOUNTER — Other Ambulatory Visit: Payer: Self-pay

## 2022-07-22 ENCOUNTER — Ambulatory Visit: Payer: BC Managed Care – PPO | Attending: Neurosurgery | Admitting: Speech Pathology

## 2022-07-22 ENCOUNTER — Encounter: Payer: Self-pay | Admitting: Speech Pathology

## 2022-07-22 DIAGNOSIS — R471 Dysarthria and anarthria: Secondary | ICD-10-CM | POA: Insufficient documentation

## 2022-07-22 DIAGNOSIS — R1310 Dysphagia, unspecified: Secondary | ICD-10-CM | POA: Insufficient documentation

## 2022-07-22 NOTE — Therapy (Signed)
OUTPATIENT SPEECH LANGUAGE PATHOLOGY SWALLOW EVALUATION   Patient Name: Gabriel Norman MRN: [id number] DOB:01/12/1971, 51 y.o., male Today's Date: 07/22/2022  PCP: none REFERRING PROVIDER: Coletta Memos, MD   End of Session - 07/22/22 1558     Visit Number 1    Number of Visits 5    Date for SLP Re-Evaluation 09/02/22    Authorization Type BC[employer]    SLP Start Time 1230    SLP Stop Time  1315    SLP Time Calculation (min) 45 min    Activity Tolerance Patient tolerated treatment well             Past Medical History:  Diagnosis Date   Sciatica    Seasonal allergies    Past Surgical History:  Procedure Laterality Date   BACK SURGERY     NECK SURGERY     Patient Active Problem List   Diagnosis Date Noted   Osteoarthritis of spine with radiculopathy, lumbar region 08/18/2017    ONSET DATE: referral 07/18/2022  REFERRING DIAG: R13.19 (ICD-10-CM) - Other dysphagia  THERAPY DIAG:  Dysphagia, unspecified type - Plan: SLP modified barium swallow, SLP plan of care cert/re-cert  Dysarthria and anarthria  Rationale for Evaluation and Treatment Rehabilitation  SUBJECTIVE:   SUBJECTIVE STATEMENT: "It's like there's a ledge in there" (points to throat) Pt accompanied by: self  PERTINENT HISTORY: Pt had artificial spinal disc replacement May 2023. Denies other significant medical history.   PAIN:  Are you having pain? No  FALLS: Has patient fallen in last 6 months?  No  LIVING ENVIRONMENT: Lives with: lives with their spouse Lives in: House/apartment  PLOF:  Level of assistance: Independent with IADLs Employment: Full-time employment   PATIENT GOALS "to swallow freely without any blockage"  OBJECTIVE:   RECOMMENDATIONS FROM OBJECTIVE SWALLOW STUDY (MBSS/FEES):  TBD  COGNITION: Overall cognitive status: Within functional limits for tasks assessed  ORAL MOTOR EXAMINATION Overall status: WFL [place] noted to elevate   CLINICAL SWALLOW ASSESSMENT:    Current diet: regular and thin liquids Dentition: adequate natural dentition Patient directly observed with POs: Yes: regular and thin liquids  Feeding: able to feed self Liquids provided by: cup, including sequential sips Oral phase signs and symptoms:  none evidence Pharyngeal phase signs and symptoms: complaints of residue  AUDITORY PERCEPTUAL VOICE ASSESSMENT: Vocal quality: no roughness or hoarseness evidenced during sustained "ah" prior to PO boluses  Vocal loudness: decreased intensity Vocal characteristics: no pitch variability, monotone. Given sustained /i/ able to slightly vary pitch from high to low   PATIENT REPORTED OUTCOME MEASURES (PROM): Deferred d/t time constraints    TODAY'S TREATMENT:  Completed clinical swallow evaluation, provided education on SLP observations and recommendations. Explained MBSS indication, purpose, and procedure. Pt in agreement for participation in video fluoroscopic swallow evaluation, SLP will place order for MD to sign. Provided education on recommended swallow strategies to aid in decreased discomfort with swallow.    PATIENT EDUCATION: Education details: see above Person educated: Patient Education method: Medical illustrator Education comprehension: verbalized understanding, returned demonstration, and needs further education   ASSESSMENT:  CLINICAL IMPRESSION: Patient is a 51 y.o. M who was seen today for dysphagia evaluation. Pt reporting ongoing dysphagia since May 2023, characterized by sensation of residue, occasional odynophagia. Pt reports "it feels like there's a ledge my food get's caught on, I have to follow every bite with liquid." SLP complete clinical swallow evaluation, including oral mechanism exam, administration of PO boluses, and informal perceptual voice evaluation. Oral mechanism  exam unremarkable for deviations in strength, ROM, or symmetry. Pt able to self feed all PO, demonstrates no overt s/sx of  aspiration, including with sequential sips of water. Facial grimace evidenced across bolus trials of varying consistencies, pt c/o reside. Per pt report, dysphagia onset aligns with surgery having spinal disc replaced (unable to access medical history details via Epic! so information re: this surgery limited). Voice is noted to be clear, yet hypophonic and mono-pitch. Vocal quality consistent preceding and following PO trials. Based on pt subjective report of significant change in swallow function and increased discomfort with swallowing all consistencies, SLP recommends MBSS to obtain objective assessment of pt's swallow function. Following, SLP will update POC to reflect targeted interventions that align with deficits evidenced. Referring provider may wish to refer for ENT consult d/t pt's change in voice.   OBJECTIVE IMPAIRMENTS include dysarthria and dysphagia. These impairments are limiting patient from safety when swallowing. Factors affecting potential to achieve goals and functional outcome are  n/a . Patient will benefit from skilled SLP services to address above impairments and improve overall function.  REHAB POTENTIAL: Good   GOALS: Goals reviewed with patient? Yes   LONG TERM GOALS: Target date: 09/02/2022  Pt will participate in MBSS to objectively assess swallow function and safety.  Baseline:  Goal status: INITIAL  2.  Pt will teach back swallow compensations/strategies to enable safe and effective PO intake.  Baseline:  Goal status: INITIAL  3.  Pt will report adherence to dysphagia HEP, if indicated per MBSS, over 1 week period with mod-I. Baseline:  Goal status: INITIAL   PLAN: SLP FREQUENCY: 1x/week  SLP DURATION: 6 weeks  PLANNED INTERVENTIONS: Aspiration precaution training, Pharyngeal strengthening exercises, Diet toleration management , Trials of upgraded texture/liquids, SLP instruction and feedback, Compensatory strategies, Patient/family education, and  Re-evaluation    Maia Breslow, CCC-SLP 07/22/2022, 4:00 PM

## 2022-07-26 ENCOUNTER — Other Ambulatory Visit (HOSPITAL_COMMUNITY): Payer: Self-pay

## 2022-07-26 DIAGNOSIS — R1310 Dysphagia, unspecified: Secondary | ICD-10-CM

## 2022-08-01 ENCOUNTER — Ambulatory Visit (HOSPITAL_COMMUNITY)
Admission: RE | Admit: 2022-08-01 | Discharge: 2022-08-01 | Disposition: A | Discharge status: Home / Self Care | Payer: BC Managed Care – PPO | Source: Ambulatory Visit | Attending: Neurosurgery | Admitting: Neurosurgery

## 2022-08-01 ENCOUNTER — Ambulatory Visit (HOSPITAL_COMMUNITY)
Admission: RE | Admit: 2022-08-01 | Discharge: 2022-08-01 | Disposition: A | Discharge status: Home / Self Care | Payer: BC Managed Care – PPO | Source: Ambulatory Visit

## 2022-08-01 DIAGNOSIS — R1310 Dysphagia, unspecified: Secondary | ICD-10-CM | POA: Diagnosis not present

## 2022-08-01 NOTE — Progress Notes (Signed)
Objective Swallowing Evaluation: Type of Study: MBS-Modified Barium Swallow Study   Patient Details  Name: Gabriel Norman MRN: [id number] Date of Birth: 1971-08-18  Today's Date: 08/01/2022 Time: SLP Start Time (ACUTE ONLY): 1135 -SLP Stop Time (ACUTE ONLY): 1155  SLP Time Calculation (min) (ACUTE ONLY): 20 min   Past Medical History:  Past Medical History:  Diagnosis Date   Sciatica    Seasonal allergies    Past Surgical History:  Past Surgical History:  Procedure Laterality Date   BACK SURGERY     NECK SURGERY     HPI: Pt is a 51 y/o male who artificial spinal disc replacement May 2023, who now complains of globus sensate and a "ledge" in his throat. PMH significant for cervical surgery.   No data recorded   Recommendations for follow up therapy are one component of a multi-disciplinary discharge planning process, led by the attending physician.  Recommendations may be updated based on patient status, additional functional criteria and insurance authorization.  Assessment / Plan / Recommendation     08/01/2022   11:59 AM  Clinical Impressions  Clinical Impression Pt seen post artifical disc replacement surgery (03/2022) and complains of globus sensation, difficulty swallowing and painful swallow. Objectively pt presents with functional oropharyngeal swallow and suspected esophageal dysphagia. Oral motor assessment appears WFL, however, pt missing lower dentition. Trace vallecula residue and multiple swallows to clear residue noted throughout study. One instance of flash penetration (PAS 2) during consecutive sips of thin liquids noted. Hyolaryngeal excursion and epiglottic inversion are functional for adequate airway protection. During a trial of a pill with thin liquid, the pill became lodged in the vallecula and required three sips of liquid to clear. The pill then remained in proximal esophagus and required multiple sips of thin liquids to clear. Pt educated on chin tuck,  effortful swallow, following solids with liquids to aid in propulsion, and potential need for GI consult to further assess esophageal phase of swallow. Primary SLP present for study, consulted and agreed with findings. Recommend pt continue regular/thin liquid diet, with soft solids, small bites/slow rate, and taking liquids after solids PRN.  SLP Visit Diagnosis Dysphagia, unspecified (R13.10)  Impact on safety and function No limitations         08/01/2022   11:59 AM  Treatment Recommendations  Treatment Recommendations Defer treatment plan to f/u with SLP         No data to display             08/01/2022   11:59 AM  Diet Recommendations  SLP Diet Recommendations Regular solids;Thin liquid  Liquid Administration via Cup;Straw  Medication Administration Whole meds with liquid  Compensations Chin tuck;Effortful swallow;Follow solids with liquid  Postural Changes Seated upright at 90 degrees         08/01/2022   11:59 AM  Other Recommendations  Recommended Consults Consider GI evaluation  Follow Up Recommendations Outpatient SLP        No data to display               08/01/2022   11:59 AM  Oral Phase  Oral Phase [facility]       08/01/2022   11:59 AM  Pharyngeal Phase  Pharyngeal Phase Impaired  Pharyngeal- Thin Teaspoon NT  Pharyngeal- Thin Cup WFL  Pharyngeal- Thin Straw Penetration/Aspiration during swallow;Pharyngeal residue - valleculae  Pharyngeal Material enters airway, remains ABOVE vocal cords then ejected out        08/01/2022   11:59 AM  Cervical Esophageal Phase   Cervical Esophageal Phase Impaired  [facility]  Mechanical Soft NT  Regular WFL  Pill Reduced cricopharyngeal relaxation     Royce Macadamia 08/01/2022, 1:22 PM

## 2022-08-05 ENCOUNTER — Ambulatory Visit: Payer: BC Managed Care – PPO | Admitting: Speech Pathology

## 2022-08-05 DIAGNOSIS — R471 Dysarthria and anarthria: Secondary | ICD-10-CM | POA: Diagnosis not present

## 2022-08-05 DIAGNOSIS — R1310 Dysphagia, unspecified: Secondary | ICD-10-CM | POA: Diagnosis not present

## 2022-08-05 NOTE — Patient Instructions (Addendum)
Hard Swallow: 100+ hard swallows; try doing 1 every 10 seconds  Voice Exercises:   5 Loud AH's as loud as you can and as long as you can  2. 5 Pitch glides up, 5 pitch glides down   3. 10 sentences in loud high pitch voice, like you are calling your neighbor over the phone  4. 10 sentences in loud low authoritative pitch, like you are the boss  Fine, thank you  Are you kidding  It's on the table  How are you?  Answer the phone  Please sign here  Thank you so much  Never mind  You're welcome  Happy Birthday  Use a good abdominal breath to power your voice when talking

## 2022-08-05 NOTE — Therapy (Signed)
OUTPATIENT SPEECH LANGUAGE PATHOLOGY TREATMENT NOTE   Patient Name: Gabriel Norman MRN: [id number] DOB:Apr 11, 1971, 51 y.o., male Today's Date: 08/05/2022  PCP: none REFERRING PROVIDER: Ashok Pall, MD   End of Session - 08/05/22 1305     Visit Number 2    Number of Visits 5    Date for SLP Re-Evaluation 09/02/22    Authorization Type [employer]    SLP Start Time 1305    SLP Stop Time  1353    SLP Time Calculation (min) 48 min    Activity Tolerance Patient tolerated treatment well             Past Medical History:  Diagnosis Date   Sciatica    Seasonal allergies    Past Surgical History:  Procedure Laterality Date   BACK SURGERY     NECK SURGERY     Patient Active Problem List   Diagnosis Date Noted   Osteoarthritis of spine with radiculopathy, lumbar region 08/18/2017    ONSET DATE: referral 07/18/2022  REFERRING DIAG: R13.19 (ICD-10-CM) - Other dysphagia  THERAPY DIAG:  Dysphagia, unspecified type  Dysarthria and anarthria  Rationale for Evaluation and Treatment Rehabilitation  SUBJECTIVE:   SUBJECTIVE STATEMENT: "It doesn't hurt as much" re: swallowing Pt accompanied by: self  PAIN:  Are you having pain? No  OBJECTIVE:   RECOMMENDATIONS FROM OBJECTIVE SWALLOW STUDY (MBSS/FEES):  08/01/2022 "Objectively pt presents with functional oropharyngeal swallow and suspected esophageal dysphagia. Oral motor assessment appears WFL, however, pt missing lower dentition. Trace vallecula residue and multiple swallows to clear residue noted throughout study. One instance of flash penetration (PAS 2) during consecutive sips of thin liquids noted. Hyolaryngeal excursion and epiglottic inversion are functional for adequate airway protection. During a trial of a pill with thin liquid, the pill became lodged in the vallecula and required three sips of liquid to clear. The pill then remained in proximal esophagus and required multiple sips of thin liquids to clear."   DYSPHAGIA  TREATMENT:   Current diet: regular and thin liquids Swallow precautions: alternate bites and sips and hard swallow Patient directly observed with POs: Yes: thin liquids  Feeding: able to feed self Liquids provided by: cup Oral phase signs and symptoms: none Pharyngeal phase signs and symptoms: complaints of residue Therapeutic exercises: Effortful Swallow; 65 completed this date Types of cueing: verbal and visual Amount of cueing: minimal  Treatment comments: Reviewed MBSS with pt, education on exercise selected to optimize swallow efficacy: hard swallow. Pt to complete 100 hard swallows daily (preferably 2x/day). Generated strategy to aid in successful completion of exercise and facilitated discussion to encourage pt to fit HEP into current routine. Pt ID x3 potential times of day he could complete exercises. Pt c/o monopitch & monoloudness. Initiated HEP to target improve vocal pitch and loudness variability. Pt able to demonstrate all exercises with usual model and occasional feedback to optimize exercise accuracy. Pt verbalizes understanding.    PATIENT EDUCATION: Education details: see above Person educated: Patient Education method: Customer service manager Education comprehension: verbalized understanding, returned demonstration, and needs further education   ASSESSMENT:  CLINICAL IMPRESSION: Patient is a 51 y.o. M who was seen today for dysphagia evaluation. Pt reporting ongoing dysphagia since May 2023, characterized by sensation of residue, occasional odynophagia. Pt reports "it feels like there's a ledge my food get's caught on, I have to follow every bite with liquid." SLP complete clinical swallow evaluation, including oral mechanism exam, administration of PO boluses, and informal perceptual voice evaluation. Oral mechanism  exam unremarkable for deviations in strength, ROM, or symmetry. Pt able to self feed all PO, demonstrates no overt s/sx of aspiration, including with  sequential sips of water. Facial grimace evidenced across bolus trials of varying consistencies, pt c/o reside. Per pt report, dysphagia onset aligns with surgery having spinal disc replaced (unable to access medical history details via Epic! so information re: this surgery limited). Voice is noted to be clear, yet hypophonic and mono-pitch. Vocal quality consistent preceding and following PO trials. Based on pt subjective report of significant change in swallow function and increased discomfort with swallowing all consistencies, SLP recommends MBSS to obtain objective assessment of pt's swallow function. Following, SLP will update POC to reflect targeted interventions that align with deficits evidenced. Referring provider may wish to refer for ENT consult d/t pt's change in voice.   OBJECTIVE IMPAIRMENTS include dysarthria and dysphagia. These impairments are limiting patient from safety when swallowing. Factors affecting potential to achieve goals and functional outcome are  n/a . Patient will benefit from skilled SLP services to address above impairments and improve overall function.  REHAB POTENTIAL: Good   GOALS: Goals reviewed with patient? Yes   LONG TERM GOALS: Target date: 09/02/2022  Pt will participate in MBSS to objectively assess swallow function and safety.  Baseline:  Goal status: MET  2.  Pt will teach back swallow compensations/strategies to enable safe and effective PO intake.  Baseline:  Goal status: MET  3.  Pt will report adherence to dysphagia HEP, if indicated per MBSS, over 1 week period with mod-I. Baseline:  Goal status: INITIAL   PLAN: SLP FREQUENCY: 1x/week  SLP DURATION: 6 weeks  PLANNED INTERVENTIONS: Aspiration precaution training, Pharyngeal strengthening exercises, Diet toleration management , Trials of upgraded texture/liquids, SLP instruction and feedback, Compensatory strategies, Patient/family education, and Re-evaluation    Su Monks,  [place] 08/05/2022, 1:56 PM

## 2022-08-19 ENCOUNTER — Ambulatory Visit: Payer: BC Managed Care – PPO | Attending: Neurosurgery | Admitting: Speech Pathology

## 2023-02-27 DIAGNOSIS — M25562 Pain in left knee: Secondary | ICD-10-CM | POA: Diagnosis not present

## 2023-02-28 DIAGNOSIS — M25562 Pain in left knee: Secondary | ICD-10-CM | POA: Diagnosis not present

## 2023-03-14 DIAGNOSIS — M1712 Unilateral primary osteoarthritis, left knee: Secondary | ICD-10-CM | POA: Diagnosis not present

## 2023-07-21 DIAGNOSIS — Z0000 Encounter for general adult medical examination without abnormal findings: Secondary | ICD-10-CM | POA: Diagnosis not present

## 2023-07-21 DIAGNOSIS — Z125 Encounter for screening for malignant neoplasm of prostate: Secondary | ICD-10-CM | POA: Diagnosis not present

## 2023-07-21 DIAGNOSIS — Z13220 Encounter for screening for lipoid disorders: Secondary | ICD-10-CM | POA: Diagnosis not present

## 2023-12-12 DIAGNOSIS — M7581 Other shoulder lesions, right shoulder: Secondary | ICD-10-CM | POA: Diagnosis not present

## 2024-01-15 DIAGNOSIS — M7541 Impingement syndrome of right shoulder: Secondary | ICD-10-CM | POA: Diagnosis not present

## 2024-02-01 DIAGNOSIS — M25611 Stiffness of right shoulder, not elsewhere classified: Secondary | ICD-10-CM | POA: Diagnosis not present

## 2024-02-01 DIAGNOSIS — M6281 Muscle weakness (generalized): Secondary | ICD-10-CM | POA: Diagnosis not present

## 2024-02-01 DIAGNOSIS — M25511 Pain in right shoulder: Secondary | ICD-10-CM | POA: Diagnosis not present

## 2024-02-09 DIAGNOSIS — M25611 Stiffness of right shoulder, not elsewhere classified: Secondary | ICD-10-CM | POA: Diagnosis not present

## 2024-02-09 DIAGNOSIS — M6281 Muscle weakness (generalized): Secondary | ICD-10-CM | POA: Diagnosis not present

## 2024-02-09 DIAGNOSIS — M25511 Pain in right shoulder: Secondary | ICD-10-CM | POA: Diagnosis not present

## 2024-02-16 DIAGNOSIS — M25511 Pain in right shoulder: Secondary | ICD-10-CM | POA: Diagnosis not present

## 2024-02-23 DIAGNOSIS — M25611 Stiffness of right shoulder, not elsewhere classified: Secondary | ICD-10-CM | POA: Diagnosis not present

## 2024-02-23 DIAGNOSIS — M6281 Muscle weakness (generalized): Secondary | ICD-10-CM | POA: Diagnosis not present

## 2024-02-23 DIAGNOSIS — M25511 Pain in right shoulder: Secondary | ICD-10-CM | POA: Diagnosis not present

## 2024-02-26 DIAGNOSIS — M7541 Impingement syndrome of right shoulder: Secondary | ICD-10-CM | POA: Diagnosis not present

## 2024-03-21 DIAGNOSIS — M25511 Pain in right shoulder: Secondary | ICD-10-CM | POA: Diagnosis not present

## 2024-03-25 DIAGNOSIS — M7541 Impingement syndrome of right shoulder: Secondary | ICD-10-CM | POA: Diagnosis not present

## 2024-04-12 DIAGNOSIS — G8918 Other acute postprocedural pain: Secondary | ICD-10-CM | POA: Diagnosis not present

## 2024-04-12 DIAGNOSIS — M7551 Bursitis of right shoulder: Secondary | ICD-10-CM | POA: Diagnosis not present

## 2024-04-12 DIAGNOSIS — M7541 Impingement syndrome of right shoulder: Secondary | ICD-10-CM | POA: Diagnosis not present

## 2024-04-12 DIAGNOSIS — Y999 Unspecified external cause status: Secondary | ICD-10-CM | POA: Diagnosis not present

## 2024-04-12 DIAGNOSIS — M7581 Other shoulder lesions, right shoulder: Secondary | ICD-10-CM | POA: Diagnosis not present

## 2024-04-12 DIAGNOSIS — S43431A Superior glenoid labrum lesion of right shoulder, initial encounter: Secondary | ICD-10-CM | POA: Diagnosis not present

## 2024-04-12 DIAGNOSIS — X58XXXA Exposure to other specified factors, initial encounter: Secondary | ICD-10-CM | POA: Diagnosis not present

## 2024-04-12 DIAGNOSIS — M65911 Unspecified synovitis and tenosynovitis, right shoulder: Secondary | ICD-10-CM | POA: Diagnosis not present

## 2024-04-12 DIAGNOSIS — M24111 Other articular cartilage disorders, right shoulder: Secondary | ICD-10-CM | POA: Diagnosis not present

## 2024-04-12 DIAGNOSIS — M7531 Calcific tendinitis of right shoulder: Secondary | ICD-10-CM | POA: Diagnosis not present

## 2024-04-22 DIAGNOSIS — M7541 Impingement syndrome of right shoulder: Secondary | ICD-10-CM | POA: Diagnosis not present

## 2024-04-22 DIAGNOSIS — M25511 Pain in right shoulder: Secondary | ICD-10-CM | POA: Diagnosis not present

## 2024-04-25 DIAGNOSIS — M7541 Impingement syndrome of right shoulder: Secondary | ICD-10-CM | POA: Diagnosis not present

## 2024-04-25 DIAGNOSIS — M25511 Pain in right shoulder: Secondary | ICD-10-CM | POA: Diagnosis not present

## 2024-07-26 DIAGNOSIS — M549 Dorsalgia, unspecified: Secondary | ICD-10-CM | POA: Diagnosis not present

## 2024-07-26 DIAGNOSIS — Z1331 Encounter for screening for depression: Secondary | ICD-10-CM | POA: Diagnosis not present

## 2024-07-26 DIAGNOSIS — R202 Paresthesia of skin: Secondary | ICD-10-CM | POA: Diagnosis not present

## 2024-07-26 DIAGNOSIS — Z0000 Encounter for general adult medical examination without abnormal findings: Secondary | ICD-10-CM | POA: Diagnosis not present

## 2024-08-07 DIAGNOSIS — M25611 Stiffness of right shoulder, not elsewhere classified: Secondary | ICD-10-CM | POA: Diagnosis not present

## 2024-08-11 DIAGNOSIS — M545 Low back pain, unspecified: Secondary | ICD-10-CM | POA: Diagnosis not present

## 2024-08-11 DIAGNOSIS — G8929 Other chronic pain: Secondary | ICD-10-CM | POA: Diagnosis not present
